# Patient Record
Sex: Female | Born: 1945 | Race: White | Hispanic: No | Marital: Married | Smoking: Never smoker
Health system: Southern US, Community
[De-identification: ages and names within clinical notes are randomized; demographics above are authoritative.]

## PROBLEM LIST (undated history)

## (undated) DIAGNOSIS — N184 Chronic kidney disease, stage 4 (severe): Secondary | ICD-10-CM

## (undated) DIAGNOSIS — G14 Postpolio syndrome: Secondary | ICD-10-CM

## (undated) DIAGNOSIS — J45909 Unspecified asthma, uncomplicated: Secondary | ICD-10-CM

## (undated) DIAGNOSIS — M1990 Unspecified osteoarthritis, unspecified site: Secondary | ICD-10-CM

## (undated) HISTORY — PX: OTHER SURGICAL HISTORY: SHX169

## (undated) HISTORY — PX: TRACHEAL ESOPHAGEAL PUNCTURE REPAIR: SHX5218

## (undated) HISTORY — PX: ABDOMINAL HYSTERECTOMY: SHX81

---

## 2014-03-05 ENCOUNTER — Emergency Department: Payer: Self-pay | Admitting: Emergency Medicine

## 2014-03-12 ENCOUNTER — Inpatient Hospital Stay: Payer: Self-pay | Admitting: Internal Medicine

## 2014-03-12 LAB — COMPREHENSIVE METABOLIC PANEL
ALBUMIN: 2.6 g/dL — AB (ref 3.4–5.0)
ALK PHOS: 80 U/L
ALT: 21 U/L (ref 12–78)
ANION GAP: 10 (ref 7–16)
AST: 31 U/L (ref 15–37)
BUN: 30 mg/dL — ABNORMAL HIGH (ref 7–18)
Bilirubin,Total: 0.3 mg/dL (ref 0.2–1.0)
Calcium, Total: 8.9 mg/dL (ref 8.5–10.1)
Chloride: 100 mmol/L (ref 98–107)
Co2: 23 mmol/L (ref 21–32)
Creatinine: 1.55 mg/dL — ABNORMAL HIGH (ref 0.60–1.30)
EGFR (Non-African Amer.): 34 — ABNORMAL LOW
GFR CALC AF AMER: 39 — AB
Glucose: 113 mg/dL — ABNORMAL HIGH (ref 65–99)
OSMOLALITY: 273 (ref 275–301)
Potassium: 3.4 mmol/L — ABNORMAL LOW (ref 3.5–5.1)
SODIUM: 133 mmol/L — AB (ref 136–145)
TOTAL PROTEIN: 7.1 g/dL (ref 6.4–8.2)

## 2014-03-12 LAB — CBC
HCT: 36.9 % (ref 35.0–47.0)
HGB: 11.9 g/dL — AB (ref 12.0–16.0)
MCH: 28.2 pg (ref 26.0–34.0)
MCHC: 32.3 g/dL (ref 32.0–36.0)
MCV: 87 fL (ref 80–100)
Platelet: 347 10*3/uL (ref 150–440)
RBC: 4.22 10*6/uL (ref 3.80–5.20)
RDW: 15 % — ABNORMAL HIGH (ref 11.5–14.5)
WBC: 14.8 10*3/uL — AB (ref 3.6–11.0)

## 2014-03-12 LAB — TROPONIN I

## 2014-03-13 LAB — COMPREHENSIVE METABOLIC PANEL
ALT: 15 U/L (ref 12–78)
AST: 13 U/L — AB (ref 15–37)
Albumin: 2.2 g/dL — ABNORMAL LOW (ref 3.4–5.0)
Alkaline Phosphatase: 64 U/L
Anion Gap: 7 (ref 7–16)
BUN: 30 mg/dL — ABNORMAL HIGH (ref 7–18)
Bilirubin,Total: 0.4 mg/dL (ref 0.2–1.0)
CREATININE: 1.65 mg/dL — AB (ref 0.60–1.30)
Calcium, Total: 8.4 mg/dL — ABNORMAL LOW (ref 8.5–10.1)
Chloride: 100 mmol/L (ref 98–107)
Co2: 26 mmol/L (ref 21–32)
EGFR (African American): 36 — ABNORMAL LOW
EGFR (Non-African Amer.): 31 — ABNORMAL LOW
Glucose: 200 mg/dL — ABNORMAL HIGH (ref 65–99)
Osmolality: 278 (ref 275–301)
Potassium: 3.2 mmol/L — ABNORMAL LOW (ref 3.5–5.1)
Sodium: 133 mmol/L — ABNORMAL LOW (ref 136–145)
TOTAL PROTEIN: 5.8 g/dL — AB (ref 6.4–8.2)

## 2014-03-13 LAB — CBC WITH DIFFERENTIAL/PLATELET
Basophil #: 0 10*3/uL (ref 0.0–0.1)
Basophil %: 0.1 %
Eosinophil #: 0.6 10*3/uL (ref 0.0–0.7)
Eosinophil %: 2.6 %
HCT: 32.8 % — AB (ref 35.0–47.0)
HGB: 10.5 g/dL — ABNORMAL LOW (ref 12.0–16.0)
LYMPHS PCT: 6.3 %
Lymphocyte #: 1.3 10*3/uL (ref 1.0–3.6)
MCH: 28 pg (ref 26.0–34.0)
MCHC: 32 g/dL (ref 32.0–36.0)
MCV: 88 fL (ref 80–100)
Monocyte #: 0.6 x10 3/mm (ref 0.2–0.9)
Monocyte %: 2.8 %
NEUTROS ABS: 18.8 10*3/uL — AB (ref 1.4–6.5)
Neutrophil %: 88.2 %
Platelet: 343 10*3/uL (ref 150–440)
RBC: 3.75 10*6/uL — ABNORMAL LOW (ref 3.80–5.20)
RDW: 14.9 % — ABNORMAL HIGH (ref 11.5–14.5)
WBC: 21.3 10*3/uL — ABNORMAL HIGH (ref 3.6–11.0)

## 2014-03-13 LAB — URINALYSIS, COMPLETE
BACTERIA: NONE SEEN
Bilirubin,UR: NEGATIVE
Glucose,UR: 50 mg/dL (ref 0–75)
KETONE: NEGATIVE
Leukocyte Esterase: NEGATIVE
NITRITE: NEGATIVE
PH: 6 (ref 4.5–8.0)
RBC,UR: 1 /HPF (ref 0–5)
SPECIFIC GRAVITY: 1.011 (ref 1.003–1.030)

## 2014-03-13 LAB — TSH: THYROID STIMULATING HORM: 0.793 u[IU]/mL

## 2014-03-13 LAB — MAGNESIUM: Magnesium: 1.3 mg/dL — ABNORMAL LOW

## 2014-03-14 LAB — BASIC METABOLIC PANEL
Anion Gap: 7 (ref 7–16)
BUN: 32 mg/dL — AB (ref 7–18)
Calcium, Total: 8.9 mg/dL (ref 8.5–10.1)
Chloride: 106 mmol/L (ref 98–107)
Co2: 25 mmol/L (ref 21–32)
Creatinine: 1.71 mg/dL — ABNORMAL HIGH (ref 0.60–1.30)
GFR CALC AF AMER: 35 — AB
GFR CALC NON AF AMER: 30 — AB
GLUCOSE: 118 mg/dL — AB (ref 65–99)
Osmolality: 284 (ref 275–301)
Potassium: 3.8 mmol/L (ref 3.5–5.1)
Sodium: 138 mmol/L (ref 136–145)

## 2014-03-14 LAB — CBC WITH DIFFERENTIAL/PLATELET
Basophil #: 0 10*3/uL (ref 0.0–0.1)
Basophil %: 0.1 %
EOS PCT: 0.4 %
Eosinophil #: 0.1 10*3/uL (ref 0.0–0.7)
HCT: 34.3 % — AB (ref 35.0–47.0)
HGB: 10.9 g/dL — ABNORMAL LOW (ref 12.0–16.0)
Lymphocyte #: 1.1 10*3/uL (ref 1.0–3.6)
Lymphocyte %: 6.6 %
MCH: 28 pg (ref 26.0–34.0)
MCHC: 31.8 g/dL — AB (ref 32.0–36.0)
MCV: 88 fL (ref 80–100)
Monocyte #: 0.3 x10 3/mm (ref 0.2–0.9)
Monocyte %: 1.8 %
NEUTROS ABS: 15.7 10*3/uL — AB (ref 1.4–6.5)
Neutrophil %: 91.1 %
Platelet: 371 10*3/uL (ref 150–440)
RBC: 3.89 10*6/uL (ref 3.80–5.20)
RDW: 15.3 % — ABNORMAL HIGH (ref 11.5–14.5)
WBC: 17.3 10*3/uL — AB (ref 3.6–11.0)

## 2014-03-15 LAB — BASIC METABOLIC PANEL
ANION GAP: 8 (ref 7–16)
BUN: 30 mg/dL — ABNORMAL HIGH (ref 7–18)
CO2: 24 mmol/L (ref 21–32)
Calcium, Total: 8.7 mg/dL (ref 8.5–10.1)
Chloride: 106 mmol/L (ref 98–107)
Creatinine: 1.54 mg/dL — ABNORMAL HIGH (ref 0.60–1.30)
EGFR (Non-African Amer.): 34 — ABNORMAL LOW
GFR CALC AF AMER: 39 — AB
Glucose: 138 mg/dL — ABNORMAL HIGH (ref 65–99)
OSMOLALITY: 284 (ref 275–301)
POTASSIUM: 3.9 mmol/L (ref 3.5–5.1)
Sodium: 138 mmol/L (ref 136–145)

## 2014-03-15 LAB — CBC WITH DIFFERENTIAL/PLATELET
Basophil #: 0 10*3/uL (ref 0.0–0.1)
Basophil %: 0.1 %
Eosinophil #: 0 10*3/uL (ref 0.0–0.7)
Eosinophil %: 0.1 %
HCT: 33.4 % — ABNORMAL LOW (ref 35.0–47.0)
HGB: 10.8 g/dL — AB (ref 12.0–16.0)
LYMPHS ABS: 1 10*3/uL (ref 1.0–3.6)
LYMPHS PCT: 7.9 %
MCH: 28.4 pg (ref 26.0–34.0)
MCHC: 32.4 g/dL (ref 32.0–36.0)
MCV: 88 fL (ref 80–100)
Monocyte #: 0.3 x10 3/mm (ref 0.2–0.9)
Monocyte %: 2.3 %
NEUTROS ABS: 11.5 10*3/uL — AB (ref 1.4–6.5)
Neutrophil %: 89.6 %
PLATELETS: 392 10*3/uL (ref 150–440)
RBC: 3.81 10*6/uL (ref 3.80–5.20)
RDW: 15.1 % — ABNORMAL HIGH (ref 11.5–14.5)
WBC: 12.8 10*3/uL — ABNORMAL HIGH (ref 3.6–11.0)

## 2014-03-16 LAB — BASIC METABOLIC PANEL
Anion Gap: 9 (ref 7–16)
BUN: 37 mg/dL — ABNORMAL HIGH (ref 7–18)
Calcium, Total: 8.7 mg/dL (ref 8.5–10.1)
Chloride: 104 mmol/L (ref 98–107)
Co2: 23 mmol/L (ref 21–32)
Creatinine: 1.24 mg/dL (ref 0.60–1.30)
EGFR (African American): 51 — ABNORMAL LOW
EGFR (Non-African Amer.): 44 — ABNORMAL LOW
GLUCOSE: 162 mg/dL — AB (ref 65–99)
OSMOLALITY: 284 (ref 275–301)
Potassium: 4.1 mmol/L (ref 3.5–5.1)
Sodium: 136 mmol/L (ref 136–145)

## 2014-03-16 LAB — VANCOMYCIN, TROUGH: Vancomycin, Trough: 20 ug/mL (ref 10–20)

## 2014-03-17 LAB — CULTURE, BLOOD (SINGLE)

## 2015-01-27 NOTE — Discharge Summary (Signed)
PATIENT NAME:  Brooke York, Brooke York MR#:  [id number] DATE OF BIRTH:  Aug 02, 1946  DATE OF ADMISSION:  03/12/2014 DATE OF DISCHARGE:  03/16/2014  PRESENTING COMPLAINT: Rash, generalized.   DISCHARGE DIAGNOSES:  1.  Generalized rash, suspected drug-induced due to Keflex, improved.  2.  Left lower extremity cellulitis.  3.  Acute renal failure, resolved.  4.  History of post polio syndrome.  5.  History of asthma.   CODE STATUS: Full code.   MEDICATIONS:  1.  Premarin 0.625 p.o. daily.  2.  Bactroban 2% apply to affected area 3 times a day.  3.  Duloxetine 60 mg delayed-release p.o. daily.  4.  Singulair 10 mg daily.  5.  Proventil 2 puffs 4 times a day as needed.  6.  Clonidine 0.2 mg b.i.d.  7.  Prednisone 20 mg taper x 10 mg daily, then stop.  8.  Bactrim DS 1 tablet b.i.d. for 6 more days.  9.  Hydralazine 25 mg 1 tablet every 8 hours as needed if systolic blood pressure greater than 160. 10. Krill oil 2 capsules daily.  11. Ubiquinol 1 capsule p.o. daily.  12. Probiotic 1 p.o. daily.  13. Dextromethorphan guaifenesin 1 tablet as needed for congestion.  14. Salmeterol 50 mcg inhalation 1 puff b.i.d.  15. Tylenol 325 mg 2 tablets every 4 hours as needed.   FOLLOWUP: With Dr. Larwance SachsBabaoff in 1 to 2 weeks.   LABS: Creatinine at discharge 1.24.   HISTORY OF PRESENT ILLNESS: Brooke York is a 69 year old Caucasian female with history of post polio syndrome, asthma, who comes into the hospital due to left lower extremity redness, swelling and failed outpatient treatment for cellulitis. She was admitted with:  1.  Left lower extremity cellulitis. Ultrasound of her lower extremity was negative for DVT. The patient was started on IV antibiotics and switched to p.o. Bactrim, which she was able to tolerate and has taken it before. Her creatinine has improved. She will finish up the course of Bactrim. Blood cultures remain negative. White count was trending down. The patient remained afebrile.  2.  Drug  rash likely due to Keflex. The patient was put on Keflex for cellulitis as an outpatient and developed macular rash throughout the body. She improved with prednisone and taper and Benadryl p.r.n.  3.  Hypertension, labile. She was taken off her triamterene/hydrochlorothiazide due to renal failure. Will continue clonidine and p.r.n. hydralazine was given.  4.  Asthma. No exacerbation. Continued Atrovent and Serevent. Hospital stay otherwise remained stable.   CODE STATUS: She remained a full code.   TIME SPENT: 40 minutes.  ____________________________ Wylie HailSona A. Allena KatzPatel, MD sap:aw D: 03/17/2014 06:51:11 ET T: 03/17/2014 07:21:46 ET JOB#: [id number]  cc: Moishe Schellenberg A. Allena KatzPatel, MD, <Dictator> Kandyce RudMarcus Babaoff, MD Willow OraSONA A Pamela Maddy MD ELECTRONICALLY SIGNED 04/05/2014 13:36

## 2015-01-27 NOTE — H&P (Signed)
PATIENT NAME:  Brooke York, Brooke York MR#:  [id number] DATE OF BIRTH:  [birth year]  DATE OF ADMISSION:  03/12/2014  CHIEF COMPLAINT: Edema, erythema of the left lower extremity after a fall 2 weeks ago and disseminated rash.   PRIMARY CARE PHYSICIAN: Kandyce Rud, MD  REFERRING PHYSICIAN: Darien Ramus, MD  HISTORY OF PRESENT ILLNESS:  This is a very nice 69 year old female with history of polio, hypopnea, hypertension, severe asthma, respiratory failure requiring BiPAP and oxygen at night only, endometriosis and hyperlipidemia. The patient comes today with a history of having a fall 2 weeks ago just because she was feeling weak, hitting her head and left lower extremity. Her left lower extremity has a bruise and the bruise enlarged at the level of the pretibial area to the point that it started having a scab on the top of the leg and forming an ulcer that is healing with a scab. This happened 2 weeks ago. She was seen in the Emergency Department. CAT scan of the head and the tibia were done and did not show any severe abnormalities. She was given antibiotics his past Monday, Keflex, because of erythema getting worse and suppuration at the level of the scab. The patient started taking the antibiotics and had some improvement for 2 to 3 days but yesterday, the erythema started getting more diffuse, covering the whole anterior tibial area up to the dorsum of the foot. On top of that, she started developing a rash that happened for the last 48 hours, 4 to 5 days after starting taking Keflex. The rash is diffuse at the level of the chest, arms and legs. Since the patient had some worsening and is starting to get more debilitated yesterday with increased work of breathing and shortness of breath, she decided to come today to the Emergency Department. In the Emergency Department, the patient is evaluated. She is showing is significant signs of sepsis with an elevation of heart rate and white blood cell count. The  patient is admitted for treatment of cellulitis that failed treatment outpatient. Apparently, she has fever of 101 yesterday. She was awake all night having fevers, chills and she was slightly confused.   REVIEW OF SYSTEMS: CONSTITUTIONAL: Positive fever. Positive chills. Positive fatigue and weakness. No significant weight loss or weight gain.  EYES: No blurry vision, double vision or changes in her eyesight.  EARS, NOSE, THROAT: No difficulty swallowing, tinnitus.  RESPIRATORY: No cough. Positive increased shortness of breath. Positive weakness. The patient had significant restrictive lung disease and whenever she gets any type of infection that demands use of energy, her respiratory muscles usually get exhausted.  Right now, she is on BiPAP.  CARDIOVASCULAR: No chest pain or orthopnea.  GASTROINTESTINAL: No nausea, vomiting, abdominal pain, constipation, diarrhea.  GENITOURINARY: No dysuria, hematuria.  ENDOCRINE: No polyuria, polydipsia, cold or heat intolerance.  HEMATOLOGIC AND LYMPHATIC: No anemia, easy bruising or bleeding.  SKIN: Positive rash, no petechiae.  MUSCULOSKELETAL: No significant neck pain or back pain. The patient has significant polio with spine deformity and muscle weakness.  NEUROLOGIC: Positive polio. Negative numbness, tingling, vertigo or ataxia.  PSYCHIATRIC: No insomnia or depression.   PAST MEDICAL HISTORY: 1.  Sleep apnea.  2.  Polio.  3.  Hypertension.  4.  Asthma.  5.  Chronic respiratory failure, on 2 liters of oxygen at night.  6.  Endometriosis.  7.  Hyperlipidemia.  8.  Restrictive lung disease with hypopnea.  9.  History of mild depression but well controlled.  ALLERGIES: THE PATIENT IS ALLERGIC TO MULTIPLE MEDICATIONS, CALCIUM CHANNEL BLOCKERS, PENICILLIN, TETRACYCLINE, VASORETIC, VASOTEC.   PAST SURGICAL HISTORY: 1.  Hysterectomy with oophorectomy due to endometriosis in the mid 70s.  2.  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  3.  Tracheostomy with reversal and plastic surgery to fix the defect.   FAMILY HISTORY: Positive for myocardial infarction in her father who also had dialysis and died by a cerebrovascular accident.  No cancer in the family, no diabetes. Positive hypertension.    SOCIAL HISTORY: The patient never smoked. Does not drink. Lives with husband.    CURRENT MEDICATIONS:  1.  Proventil HFA.  2.  Premarin 0.625 mg daily.  3.  Singulair 10 mg daily.  4.  Hydrochlorothiazide with triamterene and 50/75 mg daily.  5.  Duloxetine 60 mg once a day.  6.  Cephalexin 250 mg 4 times a day.  7.  Bactroban as needed.   PHYSICAL EXAMINATION: VITAL SIGNS: Blood pressure 178/83, pulse 96, respirations 17, temperature 100.7, oxygen saturation 99% on 2 liters of oxygen and BiPAP.  GENERAL: The patient is alert and oriented x 3, in mild respiratory distress due to weakness, tiredness, exhaustion.  HEENT: Her pupils are equal and reactive. Extraocular movements are intact. Mucosae are dry. Anicteric sclerae. Pink conjunctivae. No oral lesions. No oropharyngeal exudates.  NECK: Supple. No JVD. No thyromegaly. No adenopathy. No carotid bruits. No rigidity.  CARDIOVASCULAR: Regular rate and rhythm. Slightly tachycardic. No rubs or gallops. No murmurs. No displacement of PMI.  LUNGS: Clear without any wheezing or crepitus. Decreased respiratory air entrance at the level of the right base. 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 No dullness to percussion.  ABDOMEN: Soft, nontender, nondistended. No hepatosplenomegaly. No masses. Bowel sounds are positive.  EXTREMITIES: Left lower extremity with mild edema compared to the right side, erythema from the knee down. The patient has a scab with no secretions. No signs of abscesses at the level of the proximal pretibial area. There is erythema going down into the foot. The  toes have some purple coloration which is secondary to a previous injury, fall, with ecchymosis. Actually, the patient says that this ecchymosis is fading out now. Pulses +2. Capillary refill less than 3. Denna HaggardHomans is equivocal. The patient has tenderness to palpation of the calf muscle.  SKIN: As mentioned above, erythema of the left lower extremity but she also had some papular, diffuse, rash which is erythematous, red to pink in coloration, slightly raises, welt-like, not pruritic without any secretions. No petechiae. No purpura. The rash is diffused at the level of the chest, anterior and posterior neck, arms and legs, especially on the anterior areas.  NEUROLOGIC: Cranial nerves II through XII intact. Strength is decreased in upper and lower extremities symmetrically due to her polio and to acute disease. No focal findings. Cranial nerves II through XII are intact. Deep tendon reflexes are decreased on the lower extremities, +1.  PSYCHIATRIC: The patient is very pleasant and alert, oriented x 3.  LYMPHATIC: Negative for lymphadenopathy in neck or supraclavicular areas.  MUSCULOSKELETAL: Significant deformities due to polio at the level of the spine and hands mostly, and feet.   LABORATORY, DIAGNOSTIC AND RADIOLOGICAL DATA:  Glucose 113, BUN 30, creatinine 1.55, sodium 133, potassium 3.4, GFR is around 39, total protein 71, bilirubin 0.3. Troponin 0.02. White count 14,000, hemoglobin 11.9, platelet count 343.  ASSESSMENT AND PLAN: A 69 year old female with polio, hypertension, hypopnea,  restrictive lung disease, asthma comes today with worsening cellulitis, failure to treatment outpatient.  1.  Cellulitis. The patient has reddish cellulitis with systemic symptoms, combination of cellulitis and erysipela, started on the calf after trauma 2 weeks ago. She has been taking cephalexin which showed an improvement for 2 to 3 days and then worsening. The patient is admitted for treatment of parenteral therapy  with IV antibiotics. The patient was started on clindamycin in the ER, we are going to switch to vancomycin. Blood cultures have been taken. Unable to take any cultures from the wound as there is no significant suppuration. Continue to monitor.  2.  Sepsis. Again blood cultures taken. The patient meets criteria based on increased white blood cells and tachycardia,  3.  Chronic respiratory failure. At this moment, the patient has an acute exacerbation of this disease due to fatigue of the muscles. The patient has significant polio with deformity of the chest due to a scoliosis with significant restriction, especially of the right lung. Her peak flows are usually around 300s on a good day, right now she has been getting the 200s. She has been on a BiPAP since yesterday at home due to the fatigue of the muscles. Continue oxygen supply. At this moment, I do not think there is a need to give her steroids as there is no significant wheezing. Mostly, this is a restrictive problem and try to avoid steroids as the patient has been fighting an infection. Continue to monitor.  Call pulmonary if patient is starting to have any worsening. Dr. Meredeth Ide is her outpatient pulmonologist.  4.  Hypertension. The patient has increase of her blood pressure, has not been able to take her blood pressure medications. At this moment, we are going to continue with her hydrochlorothiazide with triamterene. Continue to monitor and add hydralazine p.r.n.  5.  Acute on chronic kidney injury. Her creatinine is 1.55. I do not have a baseline to compare but the patient says that she does not have any significant kidney failure, only just small decrease of her function. Her GFR is around 35%.  6.  Hypokalemia. Replace with oral potassium.  7.  Hyponatremia. This is likely secondary to sepsis and intravascular volume depletion. IV fluids with isotonic saline solution given at 75 mL an hour.  8.  Deep vein thrombosis prophylaxis with heparin  based on her kidney failure. The patient has also some edema of the lower extremity, asymmetric, which is likely secondary to her cellulitis but just in case we are going to get an ultrasound Doppler.  9.  Gastrointestinal prophylaxis with Pepcid.  10.  Disseminated rash. The patient has a disseminated rash that could be secondary to the use of Keflex. The patient has history of allergic reaction to penicillin and tetracycline in the past. There is a 25% to 30% cross of reactions with cephalosporins. We are going to stop the Keflex and change her antibiotics to vancomycin. Avoid steroids as mentioned above. Will give Claritin and Benadryl for now.  11.  CODE STATUS:  The patient is a full code.   TIME SPENT: I spent about 50 minutes with this patient.   ____________________________ Felipa Furnace, MD rsg:cs D: 03/12/2014 14:31:28 ET T: 03/12/2014 15:11:13 ET JOB#: [id number]  cc: Felipa Furnace, MD, <Dictator> Sallee Hogrefe Juanda Chance MD ELECTRONICALLY SIGNED 03/12/2014 19:35

## 2015-08-08 ENCOUNTER — Inpatient Hospital Stay
Admission: EM | Admit: 2015-08-08 | Discharge: 2015-08-10 | DRG: 683 | Disposition: A | Discharge status: Home / Self Care | Payer: Medicare Other | Attending: Internal Medicine | Admitting: Internal Medicine

## 2015-08-08 ENCOUNTER — Emergency Department: Payer: Medicare Other

## 2015-08-08 DIAGNOSIS — B9620 Unspecified Escherichia coli [E. coli] as the cause of diseases classified elsewhere: Secondary | ICD-10-CM | POA: Diagnosis present

## 2015-08-08 DIAGNOSIS — Z888 Allergy status to other drugs, medicaments and biological substances status: Secondary | ICD-10-CM | POA: Diagnosis not present

## 2015-08-08 DIAGNOSIS — Z1623 Resistance to quinolones and fluoroquinolones: Secondary | ICD-10-CM | POA: Diagnosis not present

## 2015-08-08 DIAGNOSIS — I129 Hypertensive chronic kidney disease with stage 1 through stage 4 chronic kidney disease, or unspecified chronic kidney disease: Secondary | ICD-10-CM | POA: Diagnosis present

## 2015-08-08 DIAGNOSIS — N184 Chronic kidney disease, stage 4 (severe): Secondary | ICD-10-CM | POA: Diagnosis present

## 2015-08-08 DIAGNOSIS — N390 Urinary tract infection, site not specified: Secondary | ICD-10-CM | POA: Diagnosis present

## 2015-08-08 DIAGNOSIS — E876 Hypokalemia: Secondary | ICD-10-CM | POA: Diagnosis not present

## 2015-08-08 DIAGNOSIS — G14 Postpolio syndrome: Secondary | ICD-10-CM | POA: Diagnosis present

## 2015-08-08 DIAGNOSIS — J9811 Atelectasis: Secondary | ICD-10-CM | POA: Diagnosis present

## 2015-08-08 DIAGNOSIS — R809 Proteinuria, unspecified: Secondary | ICD-10-CM | POA: Diagnosis present

## 2015-08-08 DIAGNOSIS — N179 Acute kidney failure, unspecified: Secondary | ICD-10-CM | POA: Diagnosis present

## 2015-08-08 DIAGNOSIS — E871 Hypo-osmolality and hyponatremia: Secondary | ICD-10-CM | POA: Diagnosis not present

## 2015-08-08 DIAGNOSIS — J986 Disorders of diaphragm: Secondary | ICD-10-CM | POA: Diagnosis present

## 2015-08-08 DIAGNOSIS — M1990 Unspecified osteoarthritis, unspecified site: Secondary | ICD-10-CM | POA: Diagnosis present

## 2015-08-08 DIAGNOSIS — G894 Chronic pain syndrome: Secondary | ICD-10-CM | POA: Diagnosis present

## 2015-08-08 DIAGNOSIS — M419 Scoliosis, unspecified: Secondary | ICD-10-CM | POA: Diagnosis present

## 2015-08-08 DIAGNOSIS — B9689 Other specified bacterial agents as the cause of diseases classified elsewhere: Secondary | ICD-10-CM | POA: Diagnosis present

## 2015-08-08 DIAGNOSIS — Z880 Allergy status to penicillin: Secondary | ICD-10-CM

## 2015-08-08 DIAGNOSIS — Z79899 Other long term (current) drug therapy: Secondary | ICD-10-CM | POA: Diagnosis not present

## 2015-08-08 DIAGNOSIS — N189 Chronic kidney disease, unspecified: Secondary | ICD-10-CM

## 2015-08-08 DIAGNOSIS — Z881 Allergy status to other antibiotic agents status: Secondary | ICD-10-CM | POA: Diagnosis not present

## 2015-08-08 DIAGNOSIS — J45909 Unspecified asthma, uncomplicated: Secondary | ICD-10-CM | POA: Diagnosis present

## 2015-08-08 DIAGNOSIS — M62838 Other muscle spasm: Secondary | ICD-10-CM | POA: Diagnosis present

## 2015-08-08 HISTORY — DX: Postpolio syndrome: G14

## 2015-08-08 HISTORY — DX: Unspecified asthma, uncomplicated: J45.909

## 2015-08-08 HISTORY — DX: Unspecified osteoarthritis, unspecified site: M19.90

## 2015-08-08 LAB — URINALYSIS COMPLETE WITH MICROSCOPIC (ARMC ONLY)
Bilirubin Urine: NEGATIVE
Glucose, UA: NEGATIVE mg/dL
Ketones, ur: NEGATIVE mg/dL
Nitrite: NEGATIVE
PROTEIN: 100 mg/dL — AB
SPECIFIC GRAVITY, URINE: 1.006 (ref 1.005–1.030)
SQUAMOUS EPITHELIAL / LPF: NONE SEEN
pH: 5 (ref 5.0–8.0)

## 2015-08-08 LAB — BASIC METABOLIC PANEL
Anion gap: 12 (ref 5–15)
BUN: 63 mg/dL — AB (ref 6–20)
CHLORIDE: 96 mmol/L — AB (ref 101–111)
CO2: 21 mmol/L — AB (ref 22–32)
CREATININE: 3.27 mg/dL — AB (ref 0.44–1.00)
Calcium: 9.2 mg/dL (ref 8.9–10.3)
GFR calc Af Amer: 15 mL/min — ABNORMAL LOW (ref >60)
GFR calc non Af Amer: 13 mL/min — ABNORMAL LOW (ref >60)
GLUCOSE: 233 mg/dL — AB (ref 65–99)
Potassium: 3 mmol/L — ABNORMAL LOW (ref 3.5–5.1)
Sodium: 129 mmol/L — ABNORMAL LOW (ref 135–145)

## 2015-08-08 LAB — CBC
HCT: 36.2 % (ref 35.0–47.0)
Hemoglobin: 12.2 g/dL (ref 12.0–16.0)
MCH: 28.8 pg (ref 26.0–34.0)
MCHC: 33.6 g/dL (ref 32.0–36.0)
MCV: 85.7 fL (ref 80.0–100.0)
PLATELETS: 433 10*3/uL (ref 150–440)
RBC: 4.23 MIL/uL (ref 3.80–5.20)
RDW: 14.7 % — ABNORMAL HIGH (ref 11.5–14.5)
WBC: 11.7 10*3/uL — ABNORMAL HIGH (ref 3.6–11.0)

## 2015-08-08 MED ORDER — UBIQUINOL 100 MG PO CAPS: 100.0000 mg | ORAL_CAPSULE | Freq: Every day | ORAL | Status: DC

## 2015-08-08 MED ORDER — HEPARIN SODIUM (PORCINE) 5000 UNIT/ML IJ SOLN
5000.0000 [IU] | Freq: Two times a day (BID) | INTRAMUSCULAR | Status: DC
  Filled 2015-08-08 (×2): qty 1

## 2015-08-08 MED ORDER — ACETAMINOPHEN 500 MG PO TABS
1000.0000 mg | ORAL_TABLET | Freq: Four times a day (QID) | ORAL | Status: DC | PRN
  Administered 2015-08-09 – 2015-08-10 (×2): 1000 mg via ORAL
  Filled 2015-08-08 (×2): qty 2

## 2015-08-08 MED ORDER — IPRATROPIUM BROMIDE 0.02 % IN SOLN
2.5000 mL | Freq: Two times a day (BID) | RESPIRATORY_TRACT | Status: DC
  Administered 2015-08-09 – 2015-08-10 (×3): 0.5 mg via RESPIRATORY_TRACT
  Filled 2015-08-08 (×3): qty 2.5

## 2015-08-08 MED ORDER — MORPHINE SULFATE (PF) 2 MG/ML IV SOLN: 1.0000 mg | INTRAVENOUS | Status: DC | PRN

## 2015-08-08 MED ORDER — CIPROFLOXACIN IN D5W 400 MG/200ML IV SOLN: 400.0000 mg | INTRAVENOUS | Status: DC

## 2015-08-08 MED ORDER — ONDANSETRON HCL 4 MG PO TABS: 4.0000 mg | ORAL_TABLET | Freq: Four times a day (QID) | ORAL | Status: DC | PRN

## 2015-08-08 MED ORDER — BISACODYL 5 MG PO TBEC: 5.0000 mg | DELAYED_RELEASE_TABLET | Freq: Every day | ORAL | Status: DC | PRN

## 2015-08-08 MED ORDER — CIPROFLOXACIN IN D5W 400 MG/200ML IV SOLN
400.0000 mg | INTRAVENOUS | Status: DC
  Filled 2015-08-08: qty 200

## 2015-08-08 MED ORDER — MONTELUKAST SODIUM 10 MG PO TABS
10.0000 mg | ORAL_TABLET | Freq: Every day | ORAL | Status: DC
  Administered 2015-08-09 – 2015-08-10 (×2): 10 mg via ORAL
  Filled 2015-08-08 (×3): qty 1

## 2015-08-08 MED ORDER — AMLODIPINE BESYLATE 5 MG PO TABS
5.0000 mg | ORAL_TABLET | Freq: Every day | ORAL | Status: DC
  Administered 2015-08-09 – 2015-08-10 (×2): 5 mg via ORAL
  Filled 2015-08-08 (×3): qty 1

## 2015-08-08 MED ORDER — CIPROFLOXACIN IN D5W 400 MG/200ML IV SOLN
400.0000 mg | Freq: Once | INTRAVENOUS | Status: AC
  Administered 2015-08-08: 400 mg via INTRAVENOUS
  Filled 2015-08-08: qty 200

## 2015-08-08 MED ORDER — OXYCODONE HCL 5 MG PO TABS: 5.0000 mg | ORAL_TABLET | ORAL | Status: DC | PRN

## 2015-08-08 MED ORDER — ACETAMINOPHEN 650 MG RE SUPP: 650.0000 mg | Freq: Four times a day (QID) | RECTAL | Status: DC | PRN

## 2015-08-08 MED ORDER — ALBUTEROL SULFATE (2.5 MG/3ML) 0.083% IN NEBU
2.5000 mg | INHALATION_SOLUTION | Freq: Four times a day (QID) | RESPIRATORY_TRACT | Status: DC | PRN
  Administered 2015-08-09: 2.5 mg via RESPIRATORY_TRACT
  Filled 2015-08-08 (×2): qty 3

## 2015-08-08 MED ORDER — DULOXETINE HCL 60 MG PO CPEP
60.0000 mg | ORAL_CAPSULE | Freq: Every day | ORAL | Status: DC
  Administered 2015-08-09 – 2015-08-10 (×2): 60 mg via ORAL
  Filled 2015-08-08 (×3): qty 1

## 2015-08-08 MED ORDER — RISAQUAD PO CAPS
1.0000 | ORAL_CAPSULE | Freq: Every day | ORAL | Status: DC
  Administered 2015-08-09 – 2015-08-10 (×2): 1 via ORAL
  Filled 2015-08-08 (×2): qty 1

## 2015-08-08 MED ORDER — ACETAMINOPHEN 325 MG PO TABS
650.0000 mg | ORAL_TABLET | Freq: Four times a day (QID) | ORAL | Status: DC | PRN
  Administered 2015-08-09: 650 mg via ORAL
  Filled 2015-08-08: qty 2

## 2015-08-08 MED ORDER — ESTROGENS CONJUGATED 0.625 MG PO TABS
0.6250 mg | ORAL_TABLET | Freq: Every day | ORAL | Status: DC
  Administered 2015-08-09 – 2015-08-10 (×2): 0.625 mg via ORAL
  Filled 2015-08-08 (×3): qty 1

## 2015-08-08 MED ORDER — CIPROFLOXACIN IN D5W 400 MG/200ML IV SOLN
400.0000 mg | Freq: Two times a day (BID) | INTRAVENOUS | Status: DC
Start: 2015-08-08 — End: 2015-08-08

## 2015-08-08 MED ORDER — VITAMIN D 50 MCG (2000 UT) PO TABS
2000.0000 [IU] | ORAL_TABLET | Freq: Every day | ORAL | Status: DC
  Administered 2015-08-09: 2000 [IU] via ORAL
  Filled 2015-08-08 (×3): qty 1

## 2015-08-08 MED ORDER — ENOXAPARIN SODIUM 30 MG/0.3ML ~~LOC~~ SOLN
30.0000 mg | SUBCUTANEOUS | Status: DC
  Filled 2015-08-08: qty 0.3

## 2015-08-08 MED ORDER — ONDANSETRON HCL 4 MG/2ML IJ SOLN
4.0000 mg | Freq: Four times a day (QID) | INTRAMUSCULAR | Status: DC | PRN
  Administered 2015-08-09: 4 mg via INTRAVENOUS
  Filled 2015-08-08: qty 2

## 2015-08-08 MED ORDER — SPIRONOLACTONE 25 MG PO TABS
50.0000 mg | ORAL_TABLET | Freq: Every day | ORAL | Status: DC
  Administered 2015-08-09 – 2015-08-10 (×2): 50 mg via ORAL
  Filled 2015-08-08 (×3): qty 2

## 2015-08-08 MED ORDER — SODIUM CHLORIDE 0.9 % IV BOLUS (SEPSIS)
1000.0000 mL | Freq: Once | INTRAVENOUS | Status: AC
  Administered 2015-08-08: 1000 mL via INTRAVENOUS

## 2015-08-08 MED ORDER — POTASSIUM CHLORIDE IN NACL 20-0.9 MEQ/L-% IV SOLN
INTRAVENOUS | Status: DC
  Administered 2015-08-08 – 2015-08-10 (×4): via INTRAVENOUS
  Filled 2015-08-08 (×5): qty 1000

## 2015-08-08 MED ORDER — MOMETASONE FURO-FORMOTEROL FUM 200-5 MCG/ACT IN AERO
2.0000 | INHALATION_SPRAY | Freq: Two times a day (BID) | RESPIRATORY_TRACT | Status: DC
  Filled 2015-08-08: qty 8.8

## 2015-08-08 MED ORDER — OMEGA-3-ACID ETHYL ESTERS 1 G PO CAPS
1000.0000 mg | ORAL_CAPSULE | Freq: Every day | ORAL | Status: DC
  Administered 2015-08-09 – 2015-08-10 (×2): 1000 mg via ORAL
  Filled 2015-08-08 (×2): qty 1

## 2015-08-08 MED ORDER — POLYETHYLENE GLYCOL 3350 17 G PO PACK: 17.0000 g | PACK | Freq: Every day | ORAL | Status: DC | PRN

## 2015-08-08 MED ORDER — VITAMIN D 1000 UNITS PO TABS
2000.0000 [IU] | ORAL_TABLET | Freq: Every day | ORAL | Status: DC
  Administered 2015-08-09 – 2015-08-10 (×2): 2000 [IU] via ORAL
  Filled 2015-08-08 (×3): qty 2

## 2015-08-08 NOTE — Progress Notes (Signed)
69 y/o F admitted with acute on chronic renal failure with CrCl of 11 ml/min ordered Lovenox 30 mg daily for DVT prophylaxis. Due to CrCl < 15 ml/min, will transition patient to heparin 5000 units [place] bid.   Luisa HartScott Delynda Sepulveda, PharmD

## 2015-08-08 NOTE — H&P (Signed)
[facility] [place] [facility]   PATIENT NAME: Brooke York    MR#:  [id number]  DATE OF BIRTH:  09/04/1946  DATE OF ADMISSION:  08/08/2015  PRIMARY CARE PHYSICIAN: BABAOFF, Lavada MesiMARC E, MD   REQUESTING/REFERRING PHYSICIAN: Dr Carollee MassedKaminski  CHIEF COMPLAINT:   Back pain and neck pain and foul-smelling urine. My kidney numbers are rising HISTORY OF PRESENT ILLNESS:  Brooke York  is a 69 y.o. female with a known history of post polio syndrome, chronic pain, history of asthma comes to the emergency room after she was found to have elevated creatinine and then her baseline by her primary care physician. Patient also has been noticing foul-smelling urine and significant back pain and muscle spasms as part of her postpolio syndrome. According to labs in Epic her baseline creatinine is 1.24 as of June 2015. Creatinine today is 3.27. Patient is being admitted for acute on chronic renal failure and urinary tract infection. Her urine looks abnormal. She received a dose of IV Cipro  PAST MEDICAL HISTORY:   Past Medical History  Diagnosis Date  . Post-polio syndrome   . Osteoarthritis   . Asthma     PAST SURGICAL HISTOIRY:  History reviewed. No pertinent past surgical history.  SOCIAL HISTORY:   Social History  Substance Use Topics  . Smoking status: Never Smoker   . Smokeless tobacco: Not on file  . Alcohol Use: No    FAMILY HISTORY:  History reviewed. No pertinent family history.  DRUG ALLERGIES:   Allergies  Allergen Reactions  . Vasotec [Enalapril Maleate] Anaphylaxis  . Keflex [Cephalexin] Hives and Swelling  . Tetracyclines & Related Other (See Comments)    Pt states that this medication caused a vaginal infection.    Marland Kitchen. Penicillins Hives and Other (See Comments)    Has patient had a PCN reaction causing immediate rash, facial/tongue/throat swelling, SOB or lightheadedness with hypotension: No Has patient had a PCN reaction causing severe rash  involving mucus membranes or skin necrosis: No Has patient had a PCN reaction that required hospitalization No Has patient had a PCN reaction occurring within the last 10 years: No If all of the above answers are "NO", then may proceed with Cephalosporin use.    REVIEW OF SYSTEMS:  Review of Systems  Constitutional: Negative for fever, chills and weight loss.  HENT: Negative for ear discharge, ear pain and nosebleeds.   Eyes: Negative for blurred vision, pain and discharge.  Respiratory: Negative for sputum production, shortness of breath, wheezing and stridor.   Cardiovascular: Negative for chest pain, palpitations, orthopnea and PND.  Gastrointestinal: Negative for nausea, vomiting, abdominal pain and diarrhea.  Genitourinary: Positive for dysuria and frequency. Negative for urgency.  Musculoskeletal: Positive for myalgias and back pain. Negative for joint pain.  Neurological: Positive for weakness. Negative for sensory change, speech change and focal weakness.  Psychiatric/Behavioral: Negative for depression and hallucinations. The patient is not nervous/anxious.      MEDICATIONS AT HOME:   Prior to Admission medications   Medication Sig Start Date End Date Taking? Authorizing Provider  acetaminophen (TYLENOL) 500 MG tablet Take 1,000 mg by mouth every 6 (six) hours as needed for mild pain.   Yes Historical Provider, MD  acidophilus (RISAQUAD) CAPS capsule Take 1 capsule by mouth daily.   Yes Historical Provider, MD  albuterol (PROVENTIL HFA;VENTOLIN HFA) 108 (90 BASE) MCG/ACT inhaler Inhale 2 puffs into the lungs every 6 (six) hours as needed for wheezing or shortness of breath.  Yes Historical Provider, MD  amLODipine (NORVASC) 5 MG tablet Take 5 mg by mouth daily.   Yes Historical Provider, MD  Cholecalciferol (VITAMIN D) 2000 UNITS tablet Take 2,000 Units by mouth daily.   Yes Historical Provider, MD  DULoxetine (CYMBALTA) 60 MG capsule Take 60 mg by mouth daily.   Yes Historical  Provider, MD  estrogens, conjugated, (PREMARIN) 0.625 MG tablet Take 0.625 mg by mouth daily.   Yes Historical Provider, MD  Fluticasone-Salmeterol (ADVAIR) 500-50 MCG/DOSE AEPB Inhale 1 puff into the lungs 2 (two) times daily.   Yes Historical Provider, MD  hydrochlorothiazide (HYDRODIURIL) 12.5 MG tablet Take 12.5 mg by mouth daily.   Yes Historical Provider, MD  ipratropium (ATROVENT HFA) 17 MCG/ACT inhaler Inhale 2 puffs into the lungs 2 (two) times daily.   Yes Historical Provider, MD  Boris Lown Oil 1000 MG CAPS Take 1,000 mg by mouth daily.   Yes Historical Provider, MD  montelukast (SINGULAIR) 10 MG tablet Take 10 mg by mouth daily.   Yes Historical Provider, MD  spironolactone (ALDACTONE) 50 MG tablet Take 50 mg by mouth daily.   Yes Historical Provider, MD  Ubiquinol 100 MG CAPS Take 100 mg by mouth daily.   Yes Historical Provider, MD      VITAL SIGNS:  Blood pressure 146/74, pulse 85, temperature 97.5 F (36.4 C), temperature source Oral, resp. rate 30, height  (1.448 m), weight 52.164 kg (115 lb), SpO2 93 %.  PHYSICAL EXAMINATION:  GENERAL:  69 y.o.-year-old patient lying in the bed with mild acute distress due to chronic pain.  EYES: Pupils equal, round, reactive to light and accommodation. No scleral icterus. Extraocular muscles intact.  HEENT: Head atraumatic, normocephalic. Oropharynx and nasopharynx clear.  NECK:  Supple, no jugular venous distention. No thyroid enlargement, no tenderness.  LUNGS: Normal breath sounds bilaterally, no wheezing, rales,rhonchi or crepitation. No use of accessory muscles of respiration.  CARDIOVASCULAR: S1, S2 normal. No murmurs, rubs, or gallops.  ABDOMEN: Soft, nontender, nondistended. Bowel sounds present. No organomegaly or mass.  EXTREMITIES: No pedal edema, cyanosis, or clubbing. Chronic contractors and upper extremities secondary to postpolio syndrome NEUROLOGIC: Cranial nerves II through XII are intact. Muscle strength 4/5 in all  extremities. Sensation intact. Gait not checked. Patient does have some deformities chronic secondary to postpolio syndrome. PSYCHIATRIC: The patient is alert and oriented x 3.  SKIN: No obvious rash, lesion, or ulcer.   LABORATORY PANEL:   CBC  Recent Labs Lab 08/08/15 1258  WBC 11.7*  HGB 12.2  HCT 36.2  PLT 433   ------------------------------------------------------------------------------------------------------------------  Chemistries   Recent Labs Lab 08/08/15 1258  NA 129*  K 3.0*  CL 96*  CO2 21*  GLUCOSE 233*  BUN 63*  CREATININE 3.27*  CALCIUM 9.2   ------------------------------------------------------------------------------------------------------------------  Cardiac Enzymes No results for input(s): TROPONINI in the last 168 hours. ------------------------------------------------------------------------------------------------------------------  RADIOLOGY:  Dg Chest 1 View  08/08/2015  CLINICAL DATA:  68 year old with current history of post polio syndrome pain and asthma, presenting with generalized weakness. EXAM: Portable CHEST 1 VIEW COMPARISON:  03/12/2014. FINDINGS: Severe thoracic scoliosis convex left and compensatory thoracolumbar scoliosis convex right. Suboptimal inspiration accounts for crowded bronchovascular markings, especially in the bases, and accentuates the cardiac silhouette. Taking this into account, cardiac silhouette upper normal in size. Mild atelectasis in the lung bases. Lungs otherwise clear. No localized airspace consolidation. No pleural effusions. No pneumothorax. Normal pulmonary vascularity. IMPRESSION: Suboptimal inspiration accounts for bibasilar atelectasis. No acute cardiopulmonary disease otherwise. Electronically Signed  By: Hulan Saas M.D.   On: 08/08/2015 18:13   IMPRESSION AND PLAN:   Ms. Dalilah Curlin is a 69 year old Caucasian female with history of post polio syndrome, asthma comes to the emergency room with  increasing creatinine and as per her primary care physician's evaluation and foul-smelling urine.  1. 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 Creatinine today is 2.27. Patient has had poor by mouth intake the last several days. We'll add admit her on medical floor. IV fluids for hydration Avoid nephrotoxins Nephrology consultation with Dr. Thedore Mins Monitor creatinine. Check ultrasound of the kidneys.  2. Urinary tract infection IV Cipro 400 twice a day. Follow-up urine culture change antibiotics accordingly.  3. Muscle spasms secondary to postpolio syndrome Continue her home meds including pain meds.  4. History of asthma continue her inhalers  5. DVT prophylaxis renal dose Lovenox.    All the records are reviewed and case discussed with ED provider. Management plans discussed with the patient, family and they are in agreement.  CODE STATUS: Full  TOTAL TIME TAKING CARE OF THIS PATIENT: 50 minutes.    Deeric Cruise M.D on 08/08/2015 at 7:17 PM  Between 7am to 6pm - Pager - [phone]  After 6pm go to [email or web address] - password [email or web address] [facility]  [place] [facility]  Office  [phone]  CC: Primary care physician; BABAOFF, Lavada Mesi, MD

## 2015-08-08 NOTE — Progress Notes (Signed)
Patient alert and oriented. Patient refuses all scheduled medication due. Patient states that she only takes her medication in the am. Patient also states that she has already taking medication this morning. Education giving to patient on medications that are due. Patient still verbally refuses.

## 2015-08-08 NOTE — ED Notes (Signed)
Patient sent here from Dr. Madelin RearBaboff office due to abnormal blood work.  Patient reports feeling weak.

## 2015-08-08 NOTE — ED Provider Notes (Signed)
[facility] Emergency Department Provider Note  ____________________________________________  Time seen:  1700  I have reviewed the triage vital signs and the nursing notes.   HISTORY  Chief Complaint Weakness     HPI Brooke York is a 69 y.o. female with a history of polio, post polio syndrome, who has chronic kidney disease. She has been told to come to the emergency department due to lab abnormalities that were noted on October 21 of this year. The patient apparently had a follow-up appointment on Monday. She was unable to make it on Monday. That was when they were going to discuss the results from the 21st with her. On Monday she was told to come to the emergency department. The patient has been feeling weak and not feel strong enough to come to the hospital until today.  The patient does report she has some shortness of breath. This is not unusual for her with her post polio syndrome. With this, she has decreased ambulation and activity.  Dr. Nani Gasser off, her primary physician, has been following her renal function tests. Her creatinine increased from the usual 1.5-1.8 area to 2.8 on October 21. Her potassium and sodium levels were normal at that time.    Past Medical History  Diagnosis Date  . Post-polio syndrome   . Osteoarthritis   . Asthma     There are no active problems to display for this patient.   History reviewed. No pertinent past surgical history.  No current outpatient prescriptions on file.  Allergies Vasotec; Keflex; Tetracyclines & related; and Penicillins  History reviewed. No pertinent family history.  Social History Social History  Substance Use Topics  . Smoking status: Never Smoker   . Smokeless tobacco: None  . Alcohol Use: No    Review of Systems  Constitutional: Negative for fever. Positive for general weakness. ENT: Negative for sore throat. Cardiovascular: Negative for chest pain. Respiratory: Mild shortness  for cough. Gastrointestinal: Negative for abdominal pain, vomiting and diarrhea. Genitourinary: History of UTIs. History of chronic kidney disease. Musculoskeletal: No myalgias or injuries. Skin: Negative for rash. Neurological: Negative for headache or focal weakness   10-point ROS otherwise negative.  ____________________________________________   PHYSICAL EXAM:  VITAL SIGNS: ED Triage Vitals  Enc Vitals Group     BP 08/08/15 1257 146/74 mmHg     Pulse Rate 08/08/15 1257 95     Resp 08/08/15 1257 16     Temp 08/08/15 1257 97.5 F (36.4 C)     Temp Source 08/08/15 1257 Oral     SpO2 08/08/15 1257 100 %     Weight 08/08/15 1257 115 lb (52.164 kg)     Height 08/08/15 1257  (1.448 m)     Head Cir --      Peak Flow --      Pain Score --      Pain Loc --      Pain Edu? --      Excl. in GC? --     Constitutional: Alert and oriented. Appears somewhat frail, tachypnea, but alert and communicative and in no acute distress. ENT   Head: Normocephalic and atraumatic.   Nose: No congestion/rhinnorhea.       Mouth: No erythema, no swelling   Cardiovascular: Normal rate, regular rhythm, no murmur noted Respiratory:  Tachypnea with mild decreased lung sounds in the right lung..    Breath sounds are clear and equal bilaterally.  Gastrointestinal: Soft and nontender. No distention.  Back: No  muscle spasm, no tenderness, no CVA tenderness. Musculoskeletal: No deformity noted. Nontender, some muscle loss, patient with limited range of motion..  No noted edema. Neurologic: Frail with some dysfunction of her lower extremities..  Skin:  Skin is warm, dry. No rash noted. Psychiatric: Mood and affect are normal. Speech and behavior are normal.  ____________________________________________    LABS (pertinent positives/negatives)  Labs Reviewed  BASIC METABOLIC PANEL - Abnormal; Notable for the following:    Sodium 129 (*)    Potassium 3.0 (*)    Chloride 96 (*)    CO2 21  (*)    Glucose, Bld 233 (*)    BUN 63 (*)    Creatinine, Ser 3.27 (*)    GFR calc non Af Amer 13 (*)    GFR calc Af Amer 15 (*)    All other components within normal limits  CBC - Abnormal; Notable for the following:    WBC 11.7 (*)    RDW 14.7 (*)    All other components within normal limits  URINALYSIS COMPLETEWITH MICROSCOPIC ([facility] ONLY) - Abnormal; Notable for the following:    Color, Urine YELLOW (*)    APPearance CLOUDY (*)    Hgb urine dipstick 1+ (*)    Protein, ur 100 (*)    Leukocytes, UA 2+ (*)    Bacteria, UA RARE (*)    All other components within normal limits  URINE CULTURE     ____________________________________________  ____________________________________________    RADIOLOGY  Chest x-ray:  FINDINGS: Severe thoracic scoliosis convex left and compensatory thoracolumbar scoliosis convex right. Suboptimal inspiration accounts for crowded bronchovascular markings, especially in the bases, and accentuates the cardiac silhouette. Taking this into account, cardiac silhouette upper normal in size. Mild atelectasis in the lung bases. Lungs otherwise clear. No localized airspace consolidation. No pleural effusions. No pneumothorax. Normal pulmonary vascularity.  IMPRESSION: Suboptimal inspiration accounts for bibasilar atelectasis. No acute cardiopulmonary disease otherwise.  ____________________________________________   PROCEDURES ____________________________________________   INITIAL IMPRESSION / ASSESSMENT AND PLAN / ED COURSE  Pertinent labs & imaging results that were available during my care of the patient were reviewed by me and considered in my medical decision making (see chart for details).  [age] year old female with history of prior polio, post polio syndrome, and chronic renal disease, now with worsened renal function tests. Her creatinine has now elevated to 3.27. Her BUN is 63. Sodium and potassium are both a little bit low.  Will  obtain a urinalysis as well due to her weakness. A chest x-ray because of her shortness of breath. We will discuss the case with nephrology and consider hospitalization for this acute on chronic renal failure.  ----------------------------------------- 6:29 PM on 08/08/2015 -----------------------------------------  Chest x-ray does not show any acute changes. Urinalysis does show white blood cells too numerous to count.  We will give the patient Cipro for the urinary tract infection (patient is allergic to Ceftin)). We'll seek admission for the acute on chronic renal failure.  ----------------------------------------- 6:32 PM on 08/08/2015 -----------------------------------------  This is been discussed with Dr. Allena KatzPatel. She will see the patient and arrange for her admission.  ____________________________________________   FINAL CLINICAL IMPRESSION(S) / ED DIAGNOSES  Final diagnoses:  Acute on chronic renal failure (HCC)  UTI (lower urinary tract infection)  Post-polio syndrome      Darien Ramusavid W Dereon Corkery, MD 08/08/15 [phone]

## 2015-08-08 NOTE — Progress Notes (Signed)
ANTIBIOTIC CONSULT NOTE - INITIAL  Pharmacy Consult for Ciprofloxacin Indication: UTI  Allergies  Allergen Reactions  . Vasotec [Enalapril Maleate] Anaphylaxis  . Keflex [Cephalexin] Hives and Swelling  . Tetracyclines & Related Other (See Comments)    Pt states that this medication caused a vaginal infection.    Marland Kitchen. Penicillins Hives and Other (See Comments)    Has patient had a PCN reaction causing immediate rash, facial/tongue/throat swelling, SOB or lightheadedness with hypotension: No Has patient had a PCN reaction causing severe rash involving mucus membranes or skin necrosis: No Has patient had a PCN reaction that required hospitalization No Has patient had a PCN reaction occurring within the last 10 years: No If all of the above answers are "NO", then may proceed with Cephalosporin use.    Patient Measurements: Height: 4\' 9"  (144.8 cm) Weight: 115 lb (52.164 kg) IBW/kg (Calculated) : 38.6  Vital Signs: Temp: 97.5 F (36.4 C) (11/02 1257) Temp Source: Oral (11/02 1257) BP: 146/74 mmHg (11/02 1257) Pulse Rate: 85 (11/02 1724) Intake/Output from previous day:   Intake/Output from this shift:    Labs:  Recent Labs  08/08/15 1258  WBC 11.7*  HGB 12.2  PLT 433  CREATININE 3.27*   Estimated Creatinine Clearance: 11 mL/min (by C-G formula based on Cr of 3.27). No results for input(s): VANCOTROUGH, VANCOPEAK, VANCORANDOM, GENTTROUGH, GENTPEAK, GENTRANDOM, TOBRATROUGH, TOBRAPEAK, TOBRARND, AMIKACINPEAK, AMIKACINTROU, AMIKACIN in the last 72 hours.   Microbiology: No results found for this or any previous visit (from the past 720 hour(s)).  Medical History: Past Medical History  Diagnosis Date  . Post-polio syndrome   . Osteoarthritis   . Asthma     Medications:  Scheduled:   Infusions:  . ciprofloxacin 400 mg (08/08/15 1854)  . ciprofloxacin    . ciprofloxacin     Assessment: 69 y/o F admitted with acute on chronic renal failure ordered ciprofloxacin for  UTI.   Plan:  Will adjust ciprofloxacin dosing to 400 mg iv q 24 hours for renal insufficiency. Will continue to follow renal function and culture results.   Luisa HartChristy, Kimarie Coor D 08/08/2015,7:27 PM

## 2015-08-08 NOTE — Plan of Care (Signed)
Problem: Tissue Perfusion: Goal: Risk factors for ineffective tissue perfusion will decrease Outcome: Not Progressing Patient refuses VTE  prevention.  SCD's and heparin. Patient education giving to patient about risks and prevention.

## 2015-08-09 ENCOUNTER — Inpatient Hospital Stay: Payer: Medicare Other

## 2015-08-09 LAB — BASIC METABOLIC PANEL
Anion gap: 11 (ref 5–15)
BUN: 50 mg/dL — AB (ref 6–20)
CALCIUM: 8.6 mg/dL — AB (ref 8.9–10.3)
CO2: 20 mmol/L — ABNORMAL LOW (ref 22–32)
CREATININE: 2.46 mg/dL — AB (ref 0.44–1.00)
Chloride: 104 mmol/L (ref 101–111)
GFR, EST AFRICAN AMERICAN: 22 mL/min — AB (ref >60)
GFR, EST NON AFRICAN AMERICAN: 19 mL/min — AB (ref >60)
Glucose, Bld: 97 mg/dL (ref 65–99)
Potassium: 3.3 mmol/L — ABNORMAL LOW (ref 3.5–5.1)
SODIUM: 135 mmol/L (ref 135–145)

## 2015-08-09 LAB — HEMOGLOBIN A1C: HEMOGLOBIN A1C: 6.2 % — AB (ref 4.0–6.0)

## 2015-08-09 LAB — PROTEIN / CREATININE RATIO, URINE
CREATININE, URINE: 26 mg/dL
Protein Creatinine Ratio: 2.77 mg/mg{Cre} — ABNORMAL HIGH (ref 0.00–0.15)
TOTAL PROTEIN, URINE: 72 mg/dL

## 2015-08-09 MED ORDER — HYDRALAZINE HCL 20 MG/ML IJ SOLN
10.0000 mg | Freq: Four times a day (QID) | INTRAMUSCULAR | Status: DC | PRN
  Administered 2015-08-09: 10 mg via INTRAVENOUS
  Filled 2015-08-09: qty 1

## 2015-08-09 MED ORDER — HEPARIN SODIUM (PORCINE) 5000 UNIT/ML IJ SOLN: 5000.0000 [IU] | Freq: Three times a day (TID) | INTRAMUSCULAR | Status: DC

## 2015-08-09 MED ORDER — LABETALOL HCL 100 MG PO TABS
100.0000 mg | ORAL_TABLET | Freq: Two times a day (BID) | ORAL | Status: DC
  Filled 2015-08-09: qty 1

## 2015-08-09 MED ORDER — NON FORMULARY: 500.0000 ug | Freq: Two times a day (BID) | Status: DC

## 2015-08-09 MED ORDER — GUAIFENESIN ER 600 MG PO TB12
600.0000 mg | ORAL_TABLET | Freq: Two times a day (BID) | ORAL | Status: DC
  Administered 2015-08-09 – 2015-08-10 (×2): 600 mg via ORAL
  Filled 2015-08-09 (×2): qty 1

## 2015-08-09 MED ORDER — METOPROLOL TARTRATE 25 MG PO TABS
25.0000 mg | ORAL_TABLET | Freq: Two times a day (BID) | ORAL | Status: DC
  Filled 2015-08-09: qty 1

## 2015-08-09 MED ORDER — CIPROFLOXACIN HCL 500 MG PO TABS
250.0000 mg | ORAL_TABLET | Freq: Two times a day (BID) | ORAL | Status: DC
  Administered 2015-08-09 – 2015-08-10 (×3): 250 mg via ORAL
  Filled 2015-08-09: qty 1
  Filled 2015-08-09: qty 0.5
  Filled 2015-08-09 (×2): qty 1

## 2015-08-09 MED ORDER — LABETALOL HCL 100 MG PO TABS
ORAL_TABLET | ORAL | Status: AC
  Administered 2015-08-09: 100 mg
  Filled 2015-08-09: qty 1

## 2015-08-09 NOTE — Progress Notes (Signed)
[facility] [place] at [facility]   PATIENT NAME: Brooke LatusKaren York    MR#:  [id number]  DATE OF BIRTH:  04/16/1946  SUBJECTIVE:  Patient is doing well this morning. Patient reports her baseline weakness but nothing new.  REVIEW OF SYSTEMS:    Review of Systems  Constitutional: Negative for fever, chills and malaise/fatigue.  HENT: Negative for sore throat.   Eyes: Negative for blurred vision.  Respiratory: Negative for cough, hemoptysis, shortness of breath and wheezing.   Cardiovascular: Negative for chest pain, palpitations and leg swelling.  Gastrointestinal: Negative for nausea, vomiting, abdominal pain, diarrhea and blood in stool.  Genitourinary: Negative for dysuria.  Musculoskeletal: Negative for back pain.  Neurological: Positive for weakness. Negative for dizziness, tremors and headaches.  Endo/Heme/Allergies: Does not bruise/bleed easily.    Tolerating Diet: Yes      DRUG ALLERGIES:   Allergies  Allergen Reactions  . Vasotec [Enalapril Maleate] Anaphylaxis  . Keflex [Cephalexin] Hives and Swelling  . Tetracyclines & Related Other (See Comments)    Pt states that this medication caused a vaginal infection.    Marland Kitchen. Penicillins Hives and Other (See Comments)    Has patient had a PCN reaction causing immediate rash, facial/tongue/throat swelling, SOB or lightheadedness with hypotension: No Has patient had a PCN reaction causing severe rash involving mucus membranes or skin necrosis: No Has patient had a PCN reaction that required hospitalization No Has patient had a PCN reaction occurring within the last 10 years: No If all of the above answers are "NO", then may proceed with Cephalosporin use.    VITALS:  Blood pressure 178/89, pulse 84, temperature 97.7 F (36.5 C), temperature source Oral, resp. rate 16, height 4\' 9"  (1.448 m), weight 46.448 kg (102 lb 6.4 oz), SpO2 98 %.  PHYSICAL EXAMINATION:   Physical Exam  Constitutional: She is  oriented to person, place, and time and well-developed, well-nourished, and in no distress. No distress.  HENT:  Head: Normocephalic.  Eyes: No scleral icterus.  Neck: Normal range of motion. Neck supple. No JVD present. No tracheal deviation present.  Cardiovascular: Normal rate, regular rhythm and normal heart sounds.  Exam reveals no gallop and no friction rub.   No murmur heard. Pulmonary/Chest: Effort normal and breath sounds normal. No respiratory distress. She has no wheezes. She has no rales. She exhibits no tenderness.  Abdominal: Soft. Bowel sounds are normal. She exhibits no distension and no mass. There is no tenderness. There is no rebound and no guarding.  Musculoskeletal: Normal range of motion. She exhibits no edema.  Chronic deformities from polio   Neurological: She is alert and oriented to person, place, and time.  Skin: Skin is warm. No rash noted. No erythema.  Psychiatric: Affect and judgment normal.      LABORATORY PANEL:   CBC  Recent Labs Lab 08/08/15 1258  WBC 11.7*  HGB 12.2  HCT 36.2  PLT 433   ------------------------------------------------------------------------------------------------------------------  Chemistries   Recent Labs Lab 08/09/15 0433  NA 135  K 3.3*  CL 104  CO2 20*  GLUCOSE 97  BUN 50*  CREATININE 2.46*  CALCIUM 8.6*   ------------------------------------------------------------------------------------------------------------------  Cardiac Enzymes No results for input(s): TROPONINI in the last 168 hours. ------------------------------------------------------------------------------------------------------------------  RADIOLOGY:  Dg Chest 1 View  08/08/2015  CLINICAL DATA:  69 year old with current history of post polio syndrome pain and asthma, presenting with generalized weakness. EXAM: Portable CHEST 1 VIEW COMPARISON:  03/12/2014. FINDINGS: Severe thoracic scoliosis convex left and  compensatory thoracolumbar  scoliosis convex right. Suboptimal inspiration accounts for crowded bronchovascular markings, especially in the bases, and accentuates the cardiac silhouette. Taking this into account, cardiac silhouette upper normal in size. Mild atelectasis in the lung bases. Lungs otherwise clear. No localized airspace consolidation. No pleural effusions. No pneumothorax. Normal pulmonary vascularity. IMPRESSION: Suboptimal inspiration accounts for bibasilar atelectasis. No acute cardiopulmonary disease otherwise. Electronically Signed   By: Hulan Saas M.D.   On: 08/08/2015 18:13     ASSESSMENT AND PLAN:   69 year old female with a history of hypertension and postpolio syndrome who presents with weakness and found of unit tract infection and acute renal failure.   1. Acute on chronic kidney failure stage III: Her baseline creatinine is 1.26. Creatinine has improved since admission with IV fluids and holding HCTZ. Patient would prefer nephrology consultation. Continue IV fluids through today.  2. Urinary tract infection without hematuria: Patient has multiple allergies to cephalosporins and penicillin. She is currently on ciprofloxacin. Follow-up on urine culture.  3. Post polio syndrome: Continue home medications.  4. History of asthma: Continue inhalers   Management plans discussed with the patient and she is in agreement.  CODE STATUS: Full  TOTAL TIME TAKING CARE OF THIS PATIENT: 30 minutes.     POSSIBLE D/C tomorrow, DEPENDING ON CLINICAL CONDITION.   Jozette Castrellon M.D on 08/09/2015 at 12:02 PM  Between 7am to 6pm - Pager - [phone] After 6pm go to [email or web address] - password [email or web address] [facility]  [facility]  Office  [phone]  CC: Primary care physician; BABAOFF, Lavada Mesi, MD  Note: This dictation was prepared with Dragon dictation along with smaller phrase technology. Any transcriptional errors that result from this process are unintentional.

## 2015-08-09 NOTE — Clinical Social Work Note (Signed)
Clinical Social Work Assessment  Patient Details  Name: Brooke York MRN: [id number] Date of Birth: September 17, 1946  Date of referral:  08/08/15               Reason for consult:  Facility Placement, Care Management Concerns, Discharge Planning                Permission sought to share information with:  Case Manager Permission granted to share information::  Yes, Verbal Permission Granted  Name::        Agency::     Relationship::     Contact Information:     Housing/Transportation Living arrangements for the past 2 months:  Single Family Home Source of Information:  Spouse Patient Interpreter Needed:  None Criminal Activity/Legal Involvement Pertinent to Current Situation/Hospitalization:  No - Comment as needed Significant Relationships:  Adult Children, Spouse Lives with:  Spouse Do you feel safe going back to the place where you live?  Yes Need for family participation in patient care:  Yes (Comment)  Care giving concerns:  Patient lives in [place] with her husband Orie Fisherman.    Social Worker assessment / plan: Holiday representative (CSW) received consult for D/C planning. CSW met patient and husband Mikki Santee at bedside. Patient was transported to a test/ procedure and husband was alone in room. CSW introduced self and explained role of CSW department. Husband reported that they live in [place] and have 2 adopted children. Husband was very knowledgeable about patient's medical care, diagnosis and medications. Husband reported that he has worked in the health care field for his entire career. Husband reported that patient does not want to be placed in a facility and wants to die at home. Per husband they remodeled their home to accommodate patient. Husband reported that patient has a door bell in her bed room that she can reach from bed if she needs something. Husband reported that patient is at baseline and will not need PT. Husband reported that he is the primary caregiver for patient and  assists her with ADL's. Husband reported that they have a good social support system. Husband inquired about Physicians making house calls. Husband reported that it is difficult to get patient to the doctors office and would like for a PA or Doctor to come to the home to see patient. Husband reported that he would not be interested in home health PT. Husband stated that they have a good set up at home and plan on returning home. RN Case Manager aware of above. Please reconsult if future social work needs arise. CSW signing off.     Employment status:  Disabled (Comment on whether or not currently receiving Disability), Retired Forensic scientist:  Medicare PT Recommendations:  Not assessed at this time Information / Referral to community resources:  Other (Comment Required) (Home Health/ Physicians making house calls. )  Patient/Family's Response to care: Husband prefers to take patient home.   Patient/Family's Understanding of and Emotional Response to Diagnosis, Current Treatment, and Prognosis:  Husband was pleasant throughout assessment and thanked CSW for visit.   Emotional Assessment Appearance:  Appears stated age Attitude/Demeanor/Rapport:  Unable to Assess Affect (typically observed):  Unable to Assess Orientation:  Oriented to Self, Oriented to Place, Oriented to  Time Alcohol / Substance use:  Not Applicable Psych involvement (Current and /or in the community):  No (Comment)  Discharge Needs  Concerns to be addressed:  Discharge Planning Concerns Readmission within the last 30 days:  No Current discharge risk:  Chronically ill Barriers to Discharge:  Continued Medical Work up   Elwyn Reach 08/09/2015, 3:31 PM

## 2015-08-09 NOTE — Consult Note (Signed)
Date: 08/09/2015                  Patient Name:  Brooke York  MRN: [id number]  DOB: 01/25/1946  Age / Sex: 69 y.o., female         PCP: BABAOFF, Lavada MesiMARC E, MD                 Service Requesting Consult:  internal medicine                  Reason for Consult:  acute on chronic renal failure             History of Present Illness: Patient is a 69 y.o. 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 She got a call from her primary care office that her creatinine was elevated . She was sent to the hospital for evaluation. Upon admission, her creatinine was noted to be elevated at 3.27. Baseline creatinine appears to be 1.9/GFR 26 from 04/16/2015 outpatient labs. Patient has history of significant use of nonsteroidal medications for various bovine joint issues including osteoarthritis, scoliosis. Today's creatinine has improved to 2.46 with IV hydration Her urinalysis showed 2+ leukocyte esterase, wbc clumps and too numerous to count bacteria. Urine culture final results are pending. She is currently being treated with ciprofloxacin Overall, she feels better as compared to yesterday Her husband is at bedside   Medications: Outpatient medications: Prescriptions prior to admission  Medication Sig Dispense Refill Last Dose  . acetaminophen (TYLENOL) 500 MG tablet Take 1,000 mg by mouth every 6 (six) hours as needed for mild pain.   08/08/2015 at 1200  . acidophilus (RISAQUAD) CAPS capsule Take 1 capsule by mouth daily.   08/08/2015 at Unknown time  . albuterol (PROVENTIL HFA;VENTOLIN HFA) 108 (90 BASE) MCG/ACT inhaler Inhale 2 puffs into the lungs every 6 (six) hours as needed for wheezing or shortness of breath.   08/08/2015 at Unknown time  . amLODipine (NORVASC) 5 MG tablet Take 5 mg by mouth daily.   08/08/2015 at Unknown time   . Cholecalciferol (VITAMIN D) 2000 UNITS tablet Take 2,000 Units by mouth daily.   08/08/2015 at Unknown time  . DULoxetine (CYMBALTA) 60 MG capsule Take 60 mg by mouth daily.   08/08/2015 at Unknown time  . estrogens, conjugated, (PREMARIN) 0.625 MG tablet Take 0.625 mg by mouth daily.   08/08/2015 at Unknown time  . Fluticasone-Salmeterol (ADVAIR) 500-50 MCG/DOSE AEPB Inhale 1 puff into the lungs 2 (two) times daily.   08/08/2015 at Unknown time  . hydrochlorothiazide (HYDRODIURIL) 12.5 MG tablet Take 12.5 mg by mouth daily.   08/08/2015 at Unknown time  . ipratropium (ATROVENT HFA) 17 MCG/ACT inhaler Inhale 2 puffs into the lungs 2 (two) times daily.   08/08/2015 at Unknown time  . Krill Oil 1000 MG CAPS Take 1,000 mg by mouth daily.   08/08/2015 at Unknown time  . montelukast (SINGULAIR) 10 MG tablet Take 10 mg by mouth daily.   08/08/2015 at Unknown time  . spironolactone (ALDACTONE) 50 MG tablet Take 50 mg by mouth daily.   08/08/2015 at Unknown time  . Ubiquinol 100 MG CAPS Take 100 mg by mouth daily.   08/08/2015 at Unknown time    Current medications: Current Facility-Administered Medications  Medication Dose Route Frequency Provider Last Rate Last Dose  . 0.9 %  NaCl with KCl 20 mEq/ L  infusion   Intravenous Continuous Enedina Finner, MD 85 mL/hr at 08/09/15 1012    . acetaminophen (TYLENOL) tablet 650 mg  650 mg Oral Q6H PRN Enedina Finner, MD       Or  . acetaminophen (TYLENOL) suppository 650 mg  650 mg Rectal Q6H PRN Enedina Finner, MD      . acetaminophen (TYLENOL) tablet 1,000 mg  1,000 mg Oral Q6H PRN Enedina Finner, MD   1,000 mg at 08/09/15 0457  . acidophilus (RISAQUAD) capsule 1 capsule  1 capsule Oral Daily Enedina Finner, MD   1 capsule at 08/09/15 0859  . albuterol (PROVENTIL) (2.5 MG/3ML) 0.083% nebulizer solution 2.5 mg  2.5 mg Inhalation Q6H PRN Enedina Finner, MD      . amLODipine (NORVASC) tablet 5 mg  5 mg Oral Daily Enedina Finner, MD   5 mg at 08/09/15 0859  . bisacodyl (DULCOLAX) EC tablet 5 mg   5 mg Oral Daily PRN Enedina Finner, MD      . cholecalciferol (VITAMIN D) tablet 2,000 Units  2,000 Units Oral Daily Enedina Finner, MD   2,000 Units at 08/09/15 [phone]  . ciprofloxacin (CIPRO) tablet 250 mg  250 mg Oral BID Adrian Saran, MD      . DULoxetine (CYMBALTA) DR capsule 60 mg  60 mg Oral Daily Enedina Finner, MD   60 mg at 08/09/15 0858  . estrogens (conjugated) (PREMARIN) tablet 0.625 mg  0.625 mg Oral Daily Enedina Finner, MD   0.625 mg at 08/09/15 0859  . heparin injection 5,000 Units  5,000 Units Subcutaneous 3 times per day Adrian Saran, MD      . ipratropium (ATROVENT) nebulizer solution 0.5 mg  2.5 mL Inhalation BID Enedina Finner, MD   0.5 mg at 08/09/15 0836  . metoprolol tartrate (LOPRESSOR) tablet 25 mg  25 mg Oral BID Adrian Saran, MD      . mometasone-formoterol (DULERA) 200-5 MCG/ACT inhaler 2 puff  2 puff Inhalation BID Enedina Finner, MD   2 puff at 08/08/15 2247  . montelukast (SINGULAIR) tablet 10 mg  10 mg Oral Daily Enedina Finner, MD   10 mg at 08/09/15 0858  . morphine 2 MG/ML injection 1 mg  1 mg Intravenous Q4H PRN Enedina Finner, MD      . omega-3 acid ethyl esters (LOVAZA) capsule 1,000 mg  1,000 mg Oral Daily Enedina Finner, MD   1,000 mg at 08/09/15 0858  . ondansetron (ZOFRAN) tablet 4 mg  4 mg Oral Q6H PRN Enedina Finner, MD       Or  . ondansetron (ZOFRAN) injection 4 mg  4 mg Intravenous Q6H PRN Enedina Finner, MD      . oxyCODONE (Oxy IR/ROXICODONE) immediate release tablet 5 mg  5 mg Oral Q4H PRN Enedina Finner, MD      . polyethylene glycol (MIRALAX / GLYCOLAX) packet 17 g  17 g Oral Daily PRN Enedina Finner, MD      . spironolactone (ALDACTONE) tablet 50 mg  50 mg Oral Daily Enedina Finner, MD   50 mg at 08/09/15 0858  . Vitamin D 2,000 Units  2,000 Units Oral Daily Enedina Finner, MD   2,000 Units at 08/08/15 2248      Allergies: Allergies  Allergen Reactions  . Vasotec [Enalapril Maleate] Anaphylaxis  . Keflex [Cephalexin] Hives and Swelling  . Tetracyclines & Related Other (See Comments)    Pt states that  this medication caused a vaginal infection.    Marland Kitchen  Penicillins Hives and Other (See Comments)    Has patient had a PCN reaction causing immediate rash, facial/tongue/throat swelling, SOB or lightheadedness with hypotension: No Has patient had a PCN reaction causing severe rash involving mucus membranes or skin necrosis: No Has patient had a PCN reaction that required hospitalization No Has patient had a PCN reaction occurring within the last 10 years: No If all of the above answers are "NO", then may proceed with Cephalosporin use.      Past Medical History: Past Medical History  Diagnosis Date  . Post-polio syndrome   . Osteoarthritis   . Asthma      Past Surgical History: History reviewed. No pertinent past surgical history.   Family History: History reviewed. No pertinent family history.   Social History: Social History   Social History  . Marital Status: Married    Spouse Name: N/A  . Number of Children: N/A  . Years of Education: N/A   Occupational History  . Not on file.   Social History Main Topics  . Smoking status: Never Smoker   . Smokeless tobacco: Not on file  . Alcohol Use: No  . Drug Use: Not on file  . Sexual Activity: Not on file   Other Topics Concern  . Not on file   Social History Narrative  . No narrative on file     Review of Systems: Gen: No fevers, chills or weight changes HEENT: Cataracts CV: No cardiac issues Resp: Patient has asthma and right diaphragm paralysis GI: No acute issues reported GU : No problems with voiding MS: Scoliosis, chronic osteoarthritis and joint pains Derm:  No acute issues Psych: No complaints Heme: No complaints Neuro: No new complaints Endocrine borderline diabetic  Vital Signs: Blood pressure 178/89, pulse 84, temperature 97.7 F (36.5 C), temperature source Oral, resp. rate 16, height 4\' 9"  (1.448 m), weight 46.448 kg (102 lb 6.4 oz), SpO2 98 %.   Intake/Output Summary (Last 24 hours) at  08/09/15 1249 Last data filed at 08/09/15 0950  Gross per 24 hour  Intake 415.08 ml  Output      0 ml  Net 415.08 ml    Weight trends: Filed Weights   08/08/15 1257 08/08/15 2032  Weight: 52.164 kg (115 lb) 46.448 kg (102 lb 6.4 oz)    Physical Exam: General:  thin appearing, sitting on the side of the bed, no distress   HEENT  bilateral cataracts, pupils are round, moist oral mucous membranes   Neck:  supple, no masses   Lungs:  mild right basilar crackles otherwise clear   Heart::  regular rhythm, soft systolic murmur   Abdomen:  soft, nontender   Extremities:  no peripheral edema   Neurologic:  alert, oriented, speech normal   Skin:  normal turgor, some discoloration over the toes possibly from color of the shoes   Access:   Foley:        Lab results: Basic Metabolic Panel:  Recent Labs Lab 08/08/15 1258 08/09/15 0433  NA 129* 135  K 3.0* 3.3*  CL 96* 104  CO2 21* 20*  GLUCOSE 233* 97  BUN 63* 50*  CREATININE 3.27* 2.46*  CALCIUM 9.2 8.6*    Liver Function Tests: No results for input(s): AST, ALT, ALKPHOS, BILITOT, PROT, ALBUMIN in the last 168 hours. No results for input(s): LIPASE, AMYLASE in the last 168 hours. No results for input(s): AMMONIA in the last 168 hours.  CBC:  Recent Labs Lab 08/08/15 1258  WBC 11.7*  HGB 12.2  HCT 36.2  MCV 85.7  PLT 433    Cardiac Enzymes: No results for input(s): CKTOTAL, TROPONINI in the last 168 hours.  BNP: Invalid input(s): POCBNP  CBG: No results for input(s): GLUCAP in the last 168 hours.  Microbiology: Recent Results (from the past 720 hour(s))  Urine culture     Status: None (Preliminary result)   Collection Time: 08/08/15  5:34 PM  Result Value Ref Range Status   Specimen Description URINE, RANDOM  Final   Special Requests NONE  Final   Culture HOLDING FOR POSSIBLE PATHOGEN  Final   Report Status PENDING  Incomplete     Coagulation Studies: No results for input(s): LABPROT, INR in the  last 72 hours.  Urinalysis:  Recent Labs  08/08/15 1734  COLORURINE YELLOW*  LABSPEC 1.006  PHURINE 5.0  GLUCOSEU NEGATIVE  HGBUR 1+*  BILIRUBINUR NEGATIVE  KETONESUR NEGATIVE  PROTEINUR 100*  NITRITE NEGATIVE  LEUKOCYTESUR 2+*      Imaging: Dg Chest 1 View  08/08/2015  CLINICAL DATA:  69 year old with current history of post polio syndrome pain and asthma, presenting with generalized weakness. EXAM: Portable CHEST 1 VIEW COMPARISON:  03/12/2014. FINDINGS: Severe thoracic scoliosis convex left and compensatory thoracolumbar scoliosis convex right. Suboptimal inspiration accounts for crowded bronchovascular markings, especially in the bases, and accentuates the cardiac silhouette. Taking this into account, cardiac silhouette upper normal in size. Mild atelectasis in the lung bases. Lungs otherwise clear. No localized airspace consolidation. No pleural effusions. No pneumothorax. Normal pulmonary vascularity. IMPRESSION: Suboptimal inspiration accounts for bibasilar atelectasis. No acute cardiopulmonary disease otherwise. Electronically Signed   By: Hulan Saas M.D.   On: 08/08/2015 18:13      Assessment & Plan: Pt is a 69 y.o. 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   1. Acute renal failure, likely multifactorial she may have become dehydrated the setting of taking diuretics and new UTI which may have lead to ATN Currently, her serum creatinine is improving with treatment of UTI as well as with IV fluid administration 2. Hyponatremia 3. Hypokalemia 4. Urinary tract infection 5. Chronic kidney disease stage IV. Baseline creatinine 1.9/GFR 26 6. Proteinuria. Also noted in 2015 June  Plan: We will obtain urine protein/creatinine ratio, screening ANA Agree with IV fluid supplementation and electrolyte supplementation as  you're doing Agree with treatment of UTI Patient will need renal imaging in the form of renal ultrasound  Will follow with you

## 2015-08-09 NOTE — Clinical Documentation Improvement (Signed)
Internal Medicine  Please provide "type" and "Frequency" of "unspecified asthma uncomplicated" for greater specificity? Thank you   Childhood  Allergic Extrinsic  Due to ( e.g.detergent fumes)  Atopic  Intrinsic, non-allergic  Hay Fever  Idiosyncratic  Late Onset  Nervous  Platinum  Mixed  Other specified cause    And   Frequency:  mild intermittent  Persistent  mild persistent  moderate persistent  severe persistent.   Other  Clinically Undetermined   Please exercise your independent, professional judgment when responding. A specific answer is not anticipated or expected.   Thank You, Brooke JumboLawanda J York [facility] [place] [phone]

## 2015-08-10 LAB — BASIC METABOLIC PANEL
Anion gap: 9 (ref 5–15)
BUN: 43 mg/dL — AB (ref 6–20)
CO2: 19 mmol/L — AB (ref 22–32)
Calcium: 8.5 mg/dL — ABNORMAL LOW (ref 8.9–10.3)
Chloride: 109 mmol/L (ref 101–111)
Creatinine, Ser: 2.47 mg/dL — ABNORMAL HIGH (ref 0.44–1.00)
GFR calc Af Amer: 21 mL/min — ABNORMAL LOW (ref >60)
GFR, EST NON AFRICAN AMERICAN: 19 mL/min — AB (ref >60)
GLUCOSE: 94 mg/dL (ref 65–99)
POTASSIUM: 4.3 mmol/L (ref 3.5–5.1)
Sodium: 137 mmol/L (ref 135–145)

## 2015-08-10 LAB — PROTEIN ELECTRO, RANDOM URINE: Total Protein, Urine: UNDETERMINED mg/dL

## 2015-08-10 LAB — PROTEIN ELECTROPHORESIS, SERUM
A/G Ratio: 0.9 (ref 0.7–1.7)
ALBUMIN ELP: 2.8 g/dL — AB (ref 2.9–4.4)
ALPHA-2-GLOBULIN: 1.1 g/dL — AB (ref 0.4–1.0)
Alpha-1-Globulin: 0.4 g/dL (ref 0.0–0.4)
BETA GLOBULIN: 1.1 g/dL (ref 0.7–1.3)
GAMMA GLOBULIN: 0.5 g/dL (ref 0.4–1.8)
Globulin, Total: 3.2 g/dL (ref 2.2–3.9)
Total Protein ELP: 6 g/dL (ref 6.0–8.5)

## 2015-08-10 MED ORDER — HYDRALAZINE HCL 25 MG PO TABS: 25.0000 mg | ORAL_TABLET | Freq: Three times a day (TID) | ORAL | Status: AC

## 2015-08-10 MED ORDER — CIPROFLOXACIN HCL 250 MG PO TABS
250.0000 mg | ORAL_TABLET | Freq: Two times a day (BID) | ORAL | Status: DC
Start: 2015-08-10 — End: 2016-11-29

## 2015-08-10 NOTE — Progress Notes (Signed)
Called Dr. Juliene PinaMody apparently during night patient teley was discontinued no documentation of MD approval.  She advised ok to leave patient off of telemetry.  Patient refuses heparin and was educated and MD advised ok to discontinue Heparin order.  [facility] and MD approved discontinuing that and replacing with order of patients own Advair inhaler.

## 2015-08-10 NOTE — Discharge Summary (Signed)
[facility] [place] [facility]   PATIENT NAME: Brooke LatusKaren York    MR#:  [id number]  DATE OF BIRTH:  09/08/1946  DATE OF ADMISSION:  08/08/2015 ADMITTING PHYSICIAN: Enedina FinnerSona Patel, MD  DATE OF DISCHARGE:08/10/2015  PRIMARY CARE PHYSICIAN: BABAOFF, MARC E, MD    ADMISSION DIAGNOSIS:  UTI (lower urinary tract infection) [N39.0] Acute on chronic renal failure (HCC) [N17.9, N18.9] Post-polio syndrome [G14]  DISCHARGE DIAGNOSIS:  Active Problems:   Acute on chronic renal failure (HCC)   SECONDARY DIAGNOSIS:   Past Medical History  Diagnosis Date  . Post-polio syndrome   . Osteoarthritis   . Asthma     HOSPITAL COURSE:   69 y.o. 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   1. Acute renal failure, likely multifactorial she may have become dehydrated the setting of taking diuretics and new UTI which may have lead to ATN Her serum creatinine improved with treatment of UTI as well as with IV fluid administration.  2. Urinary tract infection without hematuria: Patient has multiple allergies to cephalosporins and penicillin. She is currently on ciprofloxacin. She is doing well on this and therfore will be discharged. Urine culture final results are pending  3. Post polio syndrome: Continue home medications.  4. History of asthma: Continue inhalers  DISCHARGE CONDITIONS AND DIET:  Stable to HOME Regular diet  CONSULTS OBTAINED:  Treatment Team:  Mosetta PigeonHarmeet Singh, MD  DRUG ALLERGIES:   Allergies  Allergen Reactions  . Vasotec [Enalapril Maleate] Anaphylaxis  . Keflex [Cephalexin] Hives and Swelling  . Tetracyclines & Related Other (See Comments)    Pt states that this medication caused a vaginal infection.    Marland Kitchen. Penicillins Hives and Other (See Comments)    Has patient had a PCN reaction causing immediate  rash, facial/tongue/throat swelling, SOB or lightheadedness with hypotension: No Has patient had a PCN reaction causing severe rash involving mucus membranes or skin necrosis: No Has patient had a PCN reaction that required hospitalization No Has patient had a PCN reaction occurring within the last 10 years: No If all of the above answers are "NO", then may proceed with Cephalosporin use.    DISCHARGE MEDICATIONS:   Current Discharge Medication List    START taking these medications   Details  ciprofloxacin (CIPRO) 250 MG tablet Take 1 tablet (250 mg total) by mouth 2 (two) times daily. Qty: 14 tablet, Refills: 0    hydrALAZINE (APRESOLINE) 25 MG tablet Take 1 tablet (25 mg total) by mouth 3 (three) times daily. Qty: 90 tablet, Refills: 0      CONTINUE these medications which have NOT CHANGED   Details  acetaminophen (TYLENOL) 500 MG tablet Take 1,000 mg by mouth every 6 (six) hours as needed for mild pain.    acidophilus (RISAQUAD) CAPS capsule Take 1 capsule by mouth daily.    albuterol (PROVENTIL HFA;VENTOLIN HFA) 108 (90 BASE) MCG/ACT inhaler Inhale 2 puffs into the lungs every 6 (six) hours as needed for wheezing or shortness of breath.    amLODipine (NORVASC) 5 MG tablet Take 5 mg by mouth daily.    Cholecalciferol (VITAMIN D) 2000 UNITS tablet Take 2,000 Units by mouth daily.    DULoxetine (CYMBALTA) 60 MG capsule Take 60 mg by mouth daily.    estrogens, conjugated, (PREMARIN) 0.625 MG tablet Take 0.625 mg by mouth daily.    Fluticasone-Salmeterol (ADVAIR)  500-50 MCG/DOSE AEPB Inhale 1 puff into the lungs 2 (two) times daily.    ipratropium (ATROVENT HFA) 17 MCG/ACT inhaler Inhale 2 puffs into the lungs 2 (two) times daily.    Krill Oil 1000 MG CAPS Take 1,000 mg by mouth daily.    montelukast (SINGULAIR) 10 MG tablet Take 10 mg by mouth daily.    Ubiquinol 100 MG CAPS Take 100 mg by mouth daily.      STOP taking these medications     hydrochlorothiazide  (HYDRODIURIL) 12.5 MG tablet      spironolactone (ALDACTONE) 50 MG tablet               Today   CHIEF COMPLAINT:  Patient doing well this morning.   VITAL SIGNS:  Blood pressure 176/78, pulse 79, temperature 98.4 F (36.9 C), temperature source Oral, resp. rate 18, height  (1.448 m), weight 46.448 kg (102 lb 6.4 oz), SpO2 95 %.   REVIEW OF SYSTEMS:  Review of Systems  Constitutional: Negative for fever, chills and malaise/fatigue.  HENT: Negative for sore throat.   Eyes: Negative for blurred vision.  Respiratory: Negative for cough, hemoptysis, shortness of breath and wheezing.   Cardiovascular: Negative for chest pain, palpitations and leg swelling.  Gastrointestinal: Negative for nausea, vomiting, abdominal pain, diarrhea and blood in stool.  Genitourinary: Negative for dysuria.  Musculoskeletal: Negative for back pain.  Neurological: Negative for dizziness, tremors and headaches.  Endo/Heme/Allergies: Does not bruise/bleed easily.     PHYSICAL EXAMINATION:  GENERAL:  69 y.o.-year-old patient lying in the bed with no acute distress.  NECK:  Supple, no jugular venous distention. No thyroid enlargement, no tenderness.  LUNGS: Normal breath sounds bilaterally, no wheezing, rales,rhonchi  No use of accessory muscles of respiration.  CARDIOVASCULAR: S1, S2 normal. No murmurs, rubs, or gallops.  ABDOMEN: Soft, non-tender, non-distended. Bowel sounds present. No organomegaly or mass.  EXTREMITIES: No pedal edema, cyanosis, or clubbing.  PSYCHIATRIC: The patient is alert and oriented x 3.  SKIN: No obvious rash, lesion, or ulcer.  Multiple deformities from post polio syndrome DATA REVIEW:   CBC  Recent Labs Lab 08/08/15 1258  WBC 11.7*  HGB 12.2  HCT 36.2  PLT 433    Chemistries   Recent Labs Lab 08/10/15 0522  NA 137  K 4.3  CL 109  CO2 19*  GLUCOSE 94  BUN 43*  CREATININE 2.47*  CALCIUM 8.5*    Cardiac Enzymes No results for input(s):  TROPONINI in the last 168 hours.  Microbiology Results  @  RADIOLOGY:  Dg Chest 1 View  08/08/2015  CLINICAL DATA:  69 year old with current history of post polio syndrome pain and asthma, presenting with generalized weakness. EXAM: Portable CHEST 1 VIEW COMPARISON:  03/12/2014. FINDINGS: Severe thoracic scoliosis convex left and compensatory thoracolumbar scoliosis convex right. Suboptimal inspiration accounts for crowded bronchovascular markings, especially in the bases, and accentuates the cardiac silhouette. Taking this into account, cardiac silhouette upper normal in size. Mild atelectasis in the lung bases. Lungs otherwise clear. No localized airspace consolidation. No pleural effusions. No pneumothorax. Normal pulmonary vascularity. IMPRESSION: Suboptimal inspiration accounts for bibasilar atelectasis. No acute cardiopulmonary disease otherwise. Electronically Signed   By: Hulan Saas M.D.   On: 08/08/2015 18:13   US Renal  08/09/2015  CLINICAL DATA:  Acute renal failure. Current urinary tract infection. EXAM: RENAL / URINARY TRACT ULTRASOUND COMPLETE COMPARISON:  None. FINDINGS: Right Kidney: Length: 9.1 cm.  Mildly increased echogenicity.  No hydronephrosis. Left Kidney:  Length: 8.6 cm.  Mildly increased echogenicity.  No hydronephrosis. Bladder: Within normal limits. Exam degradation secondary to patient immobility and body habitus. IMPRESSION: 1.  No hydronephrosis. 2. Increased renal echogenicity, suggesting medical renal disease. 3. Decreased sensitivity and specificity exam due to technique related factors, as described above. Electronically Signed   By: Jeronimo Greaves M.D.   On: 08/09/2015 15:02      Management plans discussed with the patient and she is in agreement. Stable for discharge home  Patient should follow up with PCP in one week  CODE STATUS:     Code Status Orders        Start     Ordered   08/08/15 2049  Full code   Continuous     08/08/15 2048       TOTAL TIME TAKING CARE OF THIS PATIENT: 35 minutes.    Note: This dictation was prepared with Dragon dictation along with smaller phrase technology. Any transcriptional errors that result from this process are unintentional.  Kimra Kantor M.D on 08/10/2015 at 12:09 PM  Between 7am to 6pm - Pager - [phone] After 6pm go to [email or web address] - password [email or web address] [facility]  [facility]  Office  [phone]  CC: Primary care physician; BABAOFF, Lavada Mesi, MD

## 2015-08-10 NOTE — Progress Notes (Signed)
Subjective:  Overall feeling well Electrolytes have improved Potassium is now normal Serum creatinine is about the same as yesterday. GFR 19   Objective:  Vital signs in last 24 hours:  Temp:  [97.6 F (36.4 C)-98.4 F (36.9 C)] 98.4 F (36.9 C) (11/04 0736) Pulse Rate:  [61-112] 79 (11/04 0736) Resp:  [16-19] 18 (11/04 0736) BP: (101-179)/(54-90) 176/78 mmHg (11/04 0736) SpO2:  [94 %-97 %] 95 % (11/04 0823)  Weight change:  Filed Weights   08/08/15 1257 08/08/15 2032  Weight: 52.164 kg (115 lb) 46.448 kg (102 lb 6.4 oz)    Intake/Output:    Intake/Output Summary (Last 24 hours) at 08/10/15 1154 Last data filed at 08/10/15 0900  Gross per 24 hour  Intake   2605 ml  Output      0 ml  Net   2605 ml     Physical Exam: General:  no acute distress, laying in the bed   HEENT  BiPAP mask on   Neck  supple, no masses   Pulm/lungs  clear anteriorly and laterally   CVS/Heart  no rub or gallop   Abdomen:   soft, nontender   Extremities:  no peripheral edema   Neurologic:  alert, oriented, speech normal   Skin:  warm, dry, no acute rashes   Access:        Basic Metabolic Panel:   Recent Labs Lab 08/08/15 1258 08/09/15 0433 08/10/15 0522  NA 129* 135 137  K 3.0* 3.3* 4.3  CL 96* 104 109  CO2 21* 20* 19*  GLUCOSE 233* 97 94  BUN 63* 50* 43*  CREATININE 3.27* 2.46* 2.47*  CALCIUM 9.2 8.6* 8.5*     CBC:  Recent Labs Lab 08/08/15 1258  WBC 11.7*  HGB 12.2  HCT 36.2  MCV 85.7  PLT 433      Microbiology:  Recent Results (from the past 720 hour(s))  Urine culture     Status: None (Preliminary result)   Collection Time: 08/08/15  5:34 PM  Result Value Ref Range Status   Specimen Description URINE, RANDOM  Final   Special Requests NONE  Final   Culture   Final    >=100,000 COLONIES/mL GRAM NEGATIVE RODS IDENTIFICATION AND SUSCEPTIBILITIES TO FOLLOW    Report Status PENDING  Incomplete    Coagulation Studies: No results for input(s):  LABPROT, INR in the last 72 hours.  Urinalysis:  Recent Labs  08/08/15 1734  COLORURINE YELLOW*  LABSPEC 1.006  PHURINE 5.0  GLUCOSEU NEGATIVE  HGBUR 1+*  BILIRUBINUR NEGATIVE  KETONESUR NEGATIVE  PROTEINUR 100*  NITRITE NEGATIVE  LEUKOCYTESUR 2+*      Imaging: Dg Chest 1 View  08/08/2015  CLINICAL DATA:  69 year old with current history of post polio syndrome pain and asthma, presenting with generalized weakness. EXAM: Portable CHEST 1 VIEW COMPARISON:  03/12/2014. FINDINGS: Severe thoracic scoliosis convex left and compensatory thoracolumbar scoliosis convex right. Suboptimal inspiration accounts for crowded bronchovascular markings, especially in the bases, and accentuates the cardiac silhouette. Taking this into account, cardiac silhouette upper normal in size. Mild atelectasis in the lung bases. Lungs otherwise clear. No localized airspace consolidation. No pleural effusions. No pneumothorax. Normal pulmonary vascularity. IMPRESSION: Suboptimal inspiration accounts for bibasilar atelectasis. No acute cardiopulmonary disease otherwise. Electronically Signed   By: Hulan Saas M.D.   On: 08/08/2015 18:13   US Renal  08/09/2015  CLINICAL DATA:  Acute renal failure. Current urinary tract infection. EXAM: RENAL / URINARY TRACT ULTRASOUND COMPLETE COMPARISON:  None.  FINDINGS: Right Kidney: Length: 9.1 cm.  Mildly increased echogenicity.  No hydronephrosis. Left Kidney: Length: 8.6 cm.  Mildly increased echogenicity.  No hydronephrosis. Bladder: Within normal limits. Exam degradation secondary to patient immobility and body habitus. IMPRESSION: 1.  No hydronephrosis. 2. Increased renal echogenicity, suggesting medical renal disease. 3. Decreased sensitivity and specificity exam due to technique related factors, as described above. Electronically Signed   By: Jeronimo GreavesKyle  Talbot M.D.   On: 08/09/2015 15:02     Medications:   . 0.9 % NaCl with KCl 20 mEq / L 85 mL/hr at 08/10/15 0952   .  acidophilus  1 capsule Oral Daily  . amLODipine  5 mg Oral Daily  . cholecalciferol  2,000 Units Oral Daily  . ciprofloxacin  250 mg Oral BID  . DULoxetine  60 mg Oral Daily  . estrogens (conjugated)  0.625 mg Oral Daily  . guaiFENesin  600 mg Oral BID  . ipratropium  2.5 mL Inhalation BID  . labetalol  100 mg Oral BID  . montelukast  10 mg Oral Daily  . omega-3 acid ethyl esters  1,000 mg Oral Daily  . spironolactone  50 mg Oral Daily   acetaminophen **OR** acetaminophen, acetaminophen, albuterol, bisacodyl, morphine injection, ondansetron **OR** ondansetron (ZOFRAN) IV, oxyCODONE, polyethylene glycol  Assessment/ Plan:  69 y.o. 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   1. Acute renal failure, likely multifactorial she may have become dehydrated the setting of taking diuretics and new UTI which may have lead to ATN Her serum creatinine improved with treatment of UTI as well as with IV fluid administration 2. Hyponatremia 3. Hypokalemia 4. Urinary tract infection- gram-negative rods. Identification pending 5. Chronic kidney disease stage IV. Baseline creatinine 1.9/GFR 26 6. Proteinuria. Also noted in 2015 June. Protein/creatinine ratio 2.77  Follow-up as outpatient    LOS: 2 Brooke York 11/4/201611:54 AM

## 2015-08-10 NOTE — Care Management Important Message (Signed)
Important Message  Patient Details  Name: Brooke York MRN: [id number] Date of Birth: 06/10/1946   Medicare Important Message Given:  Yes-second notification given    Collie Siadngela Reagen Goates, RN 08/10/2015, 12:50 PM

## 2015-08-11 LAB — URINE CULTURE

## 2015-08-11 NOTE — Progress Notes (Signed)
Patient's urine culture came back positive for Klebsiella and Escherichia coli. Escherichia coli is resistant to ciprofloxacin. It is sensitive to Macrobid however patient has acute kidney injury on chronic renal failure. I spoke with the patient's husband. Patient is doing quite well and is asymptomatic at this point. Patient has a severe allergy to penicillin including hives and therefore cannot take imipenem or cephalosporins. Since the patient is doing well and is asymptomatic at this time I would continue ciprofloxacin for her Klebsiella. She has a follow up on Tuesday with Dr. Thedore MinsSingh and at that time she can have a repeat BMP as well as a urine analysis. I will speak with her after seeing in the a.m.

## 2015-08-12 LAB — ANA W/REFLEX IF POSITIVE: ANA: NEGATIVE

## 2016-11-29 ENCOUNTER — Inpatient Hospital Stay
Admission: EM | Admit: 2016-11-29 | Discharge: 2016-12-04 | DRG: 682 | Disposition: E | Payer: Medicare Other | Attending: Internal Medicine | Admitting: Internal Medicine

## 2016-11-29 ENCOUNTER — Encounter: Payer: Self-pay | Admitting: Emergency Medicine

## 2016-11-29 ENCOUNTER — Emergency Department: Payer: Medicare Other

## 2016-11-29 ENCOUNTER — Inpatient Hospital Stay: Payer: Medicare Other

## 2016-11-29 DIAGNOSIS — Z881 Allergy status to other antibiotic agents status: Secondary | ICD-10-CM

## 2016-11-29 DIAGNOSIS — G4089 Other seizures: Secondary | ICD-10-CM | POA: Diagnosis not present

## 2016-11-29 DIAGNOSIS — Z833 Family history of diabetes mellitus: Secondary | ICD-10-CM | POA: Diagnosis not present

## 2016-11-29 DIAGNOSIS — N170 Acute kidney failure with tubular necrosis: Principal | ICD-10-CM | POA: Diagnosis present

## 2016-11-29 DIAGNOSIS — G4730 Sleep apnea, unspecified: Secondary | ICD-10-CM | POA: Diagnosis present

## 2016-11-29 DIAGNOSIS — E860 Dehydration: Secondary | ICD-10-CM | POA: Diagnosis present

## 2016-11-29 DIAGNOSIS — J986 Disorders of diaphragm: Secondary | ICD-10-CM | POA: Diagnosis present

## 2016-11-29 DIAGNOSIS — M419 Scoliosis, unspecified: Secondary | ICD-10-CM | POA: Diagnosis present

## 2016-11-29 DIAGNOSIS — Z681 Body mass index (BMI) 19 or less, adult: Secondary | ICD-10-CM

## 2016-11-29 DIAGNOSIS — Z515 Encounter for palliative care: Secondary | ICD-10-CM | POA: Diagnosis present

## 2016-11-29 DIAGNOSIS — Z90710 Acquired absence of both cervix and uterus: Secondary | ICD-10-CM

## 2016-11-29 DIAGNOSIS — N179 Acute kidney failure, unspecified: Secondary | ICD-10-CM | POA: Diagnosis present

## 2016-11-29 DIAGNOSIS — J45909 Unspecified asthma, uncomplicated: Secondary | ICD-10-CM | POA: Diagnosis present

## 2016-11-29 DIAGNOSIS — G14 Postpolio syndrome: Secondary | ICD-10-CM | POA: Diagnosis present

## 2016-11-29 DIAGNOSIS — Z66 Do not resuscitate: Secondary | ICD-10-CM | POA: Diagnosis present

## 2016-11-29 DIAGNOSIS — N184 Chronic kidney disease, stage 4 (severe): Secondary | ICD-10-CM | POA: Diagnosis present

## 2016-11-29 DIAGNOSIS — E876 Hypokalemia: Secondary | ICD-10-CM | POA: Diagnosis present

## 2016-11-29 DIAGNOSIS — J9600 Acute respiratory failure, unspecified whether with hypoxia or hypercapnia: Secondary | ICD-10-CM | POA: Diagnosis present

## 2016-11-29 DIAGNOSIS — I129 Hypertensive chronic kidney disease with stage 1 through stage 4 chronic kidney disease, or unspecified chronic kidney disease: Secondary | ICD-10-CM | POA: Diagnosis present

## 2016-11-29 DIAGNOSIS — M1990 Unspecified osteoarthritis, unspecified site: Secondary | ICD-10-CM | POA: Diagnosis present

## 2016-11-29 DIAGNOSIS — R64 Cachexia: Secondary | ICD-10-CM | POA: Diagnosis present

## 2016-11-29 DIAGNOSIS — E872 Acidosis, unspecified: Secondary | ICD-10-CM

## 2016-11-29 DIAGNOSIS — Z7951 Long term (current) use of inhaled steroids: Secondary | ICD-10-CM

## 2016-11-29 DIAGNOSIS — E871 Hypo-osmolality and hyponatremia: Secondary | ICD-10-CM | POA: Diagnosis present

## 2016-11-29 DIAGNOSIS — K859 Acute pancreatitis without necrosis or infection, unspecified: Secondary | ICD-10-CM

## 2016-11-29 DIAGNOSIS — R531 Weakness: Secondary | ICD-10-CM | POA: Diagnosis present

## 2016-11-29 DIAGNOSIS — Z882 Allergy status to sulfonamides status: Secondary | ICD-10-CM

## 2016-11-29 DIAGNOSIS — N3000 Acute cystitis without hematuria: Secondary | ICD-10-CM | POA: Diagnosis present

## 2016-11-29 DIAGNOSIS — R569 Unspecified convulsions: Secondary | ICD-10-CM

## 2016-11-29 DIAGNOSIS — Z8249 Family history of ischemic heart disease and other diseases of the circulatory system: Secondary | ICD-10-CM

## 2016-11-29 DIAGNOSIS — Z888 Allergy status to other drugs, medicaments and biological substances status: Secondary | ICD-10-CM

## 2016-11-29 HISTORY — DX: Chronic kidney disease, stage 4 (severe): N18.4

## 2016-11-29 LAB — URINALYSIS, COMPLETE (UACMP) WITH MICROSCOPIC
BILIRUBIN URINE: NEGATIVE
GLUCOSE, UA: 50 mg/dL — AB
KETONES UR: NEGATIVE mg/dL
LEUKOCYTES UA: NEGATIVE
NITRITE: NEGATIVE
PH: 5 (ref 5.0–8.0)
Protein, ur: >=300 mg/dL — AB
Specific Gravity, Urine: 1.008 (ref 1.005–1.030)

## 2016-11-29 LAB — COMPREHENSIVE METABOLIC PANEL
ALK PHOS: 66 U/L (ref 38–126)
ALT: 18 U/L (ref 14–54)
AST: 22 U/L (ref 15–41)
Albumin: 3.8 g/dL (ref 3.5–5.0)
BILIRUBIN TOTAL: 0.8 mg/dL (ref 0.3–1.2)
BUN: 118 mg/dL — ABNORMAL HIGH (ref 6–20)
CALCIUM: 5.9 mg/dL — AB (ref 8.9–10.3)
CREATININE: 8.19 mg/dL — AB (ref 0.44–1.00)
Chloride: 93 mmol/L — ABNORMAL LOW (ref 101–111)
GFR calc non Af Amer: 4 mL/min — ABNORMAL LOW (ref >60)
GFR, EST AFRICAN AMERICAN: 5 mL/min — AB (ref >60)
GLUCOSE: 82 mg/dL (ref 65–99)
Potassium: 3.4 mmol/L — ABNORMAL LOW (ref 3.5–5.1)
SODIUM: 125 mmol/L — AB (ref 135–145)
TOTAL PROTEIN: 7.4 g/dL (ref 6.5–8.1)

## 2016-11-29 LAB — BASIC METABOLIC PANEL
Anion gap: 21 — ABNORMAL HIGH (ref 5–15)
BUN: 101 mg/dL — AB (ref 6–20)
CALCIUM: 4.8 mg/dL — AB (ref 8.9–10.3)
CO2: 7 mmol/L — ABNORMAL LOW (ref 22–32)
CREATININE: 6.86 mg/dL — AB (ref 0.44–1.00)
Chloride: 98 mmol/L — ABNORMAL LOW (ref 101–111)
GFR calc Af Amer: 6 mL/min — ABNORMAL LOW (ref >60)
GFR, EST NON AFRICAN AMERICAN: 5 mL/min — AB (ref >60)
GLUCOSE: 88 mg/dL (ref 65–99)
POTASSIUM: 2.3 mmol/L — AB (ref 3.5–5.1)
SODIUM: 126 mmol/L — AB (ref 135–145)

## 2016-11-29 LAB — CBC
HCT: 29.7 % — ABNORMAL LOW (ref 35.0–47.0)
Hemoglobin: 9.8 g/dL — ABNORMAL LOW (ref 12.0–16.0)
MCH: 30.9 pg (ref 26.0–34.0)
MCHC: 32.9 g/dL (ref 32.0–36.0)
MCV: 94.2 fL (ref 80.0–100.0)
PLATELETS: 399 10*3/uL (ref 150–440)
RBC: 3.16 MIL/uL — ABNORMAL LOW (ref 3.80–5.20)
RDW: 18.6 % — AB (ref 11.5–14.5)
WBC: 16.9 10*3/uL — ABNORMAL HIGH (ref 3.6–11.0)

## 2016-11-29 LAB — PROTIME-INR
INR: 1.09
Prothrombin Time: 14.1 seconds (ref 11.4–15.2)

## 2016-11-29 LAB — MRSA PCR SCREENING: MRSA BY PCR: NEGATIVE

## 2016-11-29 LAB — BLOOD GAS, VENOUS
Acid-base deficit: 23.4 mmol/L — ABNORMAL HIGH (ref 0.0–2.0)
BICARBONATE: 6.3 mmol/L — AB (ref 20.0–28.0)
DELIVERY SYSTEMS: POSITIVE
FIO2: 0.21
O2 Saturation: 54 %
PO2 VEN: 45 mmHg (ref 32.0–45.0)
Patient temperature: 37
pCO2, Ven: 25 mmHg — ABNORMAL LOW (ref 44.0–60.0)
pH, Ven: 7.01 — CL (ref 7.250–7.430)

## 2016-11-29 LAB — LIPASE, BLOOD: LIPASE: 487 U/L — AB (ref 11–51)

## 2016-11-29 LAB — LACTIC ACID, PLASMA
Lactic Acid, Venous: 0.6 mmol/L (ref 0.5–1.9)
Lactic Acid, Venous: 1.2 mmol/L (ref 0.5–1.9)

## 2016-11-29 LAB — INFLUENZA PANEL BY PCR (TYPE A & B)
Influenza A By PCR: NEGATIVE
Influenza B By PCR: NEGATIVE

## 2016-11-29 LAB — BRAIN NATRIURETIC PEPTIDE: B Natriuretic Peptide: 446 pg/mL — ABNORMAL HIGH (ref 0.0–100.0)

## 2016-11-29 LAB — TROPONIN I: TROPONIN I: 0.04 ng/mL — AB (ref <0.03)

## 2016-11-29 LAB — PROCALCITONIN: Procalcitonin: 0.41 ng/mL

## 2016-11-29 MED ORDER — ESTROGENS CONJUGATED 0.625 MG PO TABS
0.6250 mg | ORAL_TABLET | Freq: Every day | ORAL | Status: DC
  Filled 2016-11-29: qty 1

## 2016-11-29 MED ORDER — MORPHINE SULFATE (PF) 4 MG/ML IV SOLN
2.0000 mg | INTRAVENOUS | Status: DC | PRN
  Filled 2016-11-29: qty 1

## 2016-11-29 MED ORDER — ACETAMINOPHEN 500 MG PO TABS: 1000.0000 mg | ORAL_TABLET | Freq: Four times a day (QID) | ORAL | Status: DC | PRN

## 2016-11-29 MED ORDER — SODIUM CHLORIDE 0.9 % IV SOLN
1.0000 g | Freq: Once | INTRAVENOUS | Status: AC
  Administered 2016-11-29: 1 g via INTRAVENOUS
  Filled 2016-11-29: qty 10

## 2016-11-29 MED ORDER — SODIUM CHLORIDE 0.9 % IV BOLUS (SEPSIS)
1000.0000 mL | Freq: Once | INTRAVENOUS | Status: AC
  Administered 2016-11-29: 1000 mL via INTRAVENOUS

## 2016-11-29 MED ORDER — MONTELUKAST SODIUM 10 MG PO TABS: 10.0000 mg | ORAL_TABLET | Freq: Every day | ORAL | Status: DC

## 2016-11-29 MED ORDER — RISAQUAD PO CAPS: 1.0000 | ORAL_CAPSULE | Freq: Every day | ORAL | Status: DC

## 2016-11-29 MED ORDER — DEXTROSE 5 % IV SOLN
2.0000 g | Freq: Once | INTRAVENOUS | Status: AC
  Administered 2016-11-29: 2 g via INTRAVENOUS
  Filled 2016-11-29: qty 2

## 2016-11-29 MED ORDER — ONDANSETRON HCL 4 MG PO TABS: 4.0000 mg | ORAL_TABLET | Freq: Four times a day (QID) | ORAL | Status: DC | PRN

## 2016-11-29 MED ORDER — MOMETASONE FURO-FORMOTEROL FUM 200-5 MCG/ACT IN AERO
2.0000 | INHALATION_SPRAY | Freq: Two times a day (BID) | RESPIRATORY_TRACT | Status: DC
  Filled 2016-11-29: qty 8.8

## 2016-11-29 MED ORDER — DEXTROSE 5 % IV SOLN: 30.0000 mmol | Freq: Once | INTRAVENOUS | Status: DC

## 2016-11-29 MED ORDER — SODIUM CHLORIDE 0.9% FLUSH
3.0000 mL | Freq: Two times a day (BID) | INTRAVENOUS | Status: DC
  Administered 2016-11-29 – 2016-11-30 (×2): 3 mL via INTRAVENOUS

## 2016-11-29 MED ORDER — SODIUM BICARBONATE 8.4 % IV SOLN
INTRAVENOUS | Status: DC
  Administered 2016-11-29 – 2016-11-30 (×2): via INTRAVENOUS
  Filled 2016-11-29 (×4): qty 150

## 2016-11-29 MED ORDER — UBIQUINOL 100 MG PO CAPS: 100.0000 mg | ORAL_CAPSULE | Freq: Every day | ORAL | Status: DC

## 2016-11-29 MED ORDER — DULOXETINE HCL 30 MG PO CPEP: 60.0000 mg | ORAL_CAPSULE | Freq: Every day | ORAL | Status: DC

## 2016-11-29 MED ORDER — ONDANSETRON HCL 4 MG/2ML IJ SOLN: 4.0000 mg | Freq: Four times a day (QID) | INTRAMUSCULAR | Status: DC | PRN

## 2016-11-29 MED ORDER — VANCOMYCIN HCL IN DEXTROSE 1-5 GM/200ML-% IV SOLN: 1000.0000 mg | Freq: Once | INTRAVENOUS | Status: DC

## 2016-11-29 MED ORDER — VITAMIN D 1000 UNITS PO TABS
2000.0000 [IU] | ORAL_TABLET | Freq: Every day | ORAL | Status: DC
Start: 2016-11-29 — End: 2016-11-30

## 2016-11-29 MED ORDER — AMLODIPINE BESYLATE 5 MG PO TABS: 5.0000 mg | ORAL_TABLET | Freq: Every day | ORAL | Status: DC

## 2016-11-29 MED ORDER — CLONIDINE HCL 0.1 MG PO TABS: 0.1000 mg | ORAL_TABLET | Freq: Three times a day (TID) | ORAL | Status: DC

## 2016-11-29 MED ORDER — CIPROFLOXACIN IN D5W 400 MG/200ML IV SOLN
400.0000 mg | INTRAVENOUS | Status: DC
  Administered 2016-11-29: 400 mg via INTRAVENOUS
  Filled 2016-11-29 (×2): qty 200

## 2016-11-29 MED ORDER — SODIUM CHLORIDE 0.9 % IV SOLN
INTRAVENOUS | Status: DC
  Filled 2016-11-29: qty 150

## 2016-11-29 MED ORDER — SODIUM CHLORIDE 0.9 % IV SOLN
Freq: Once | INTRAVENOUS | Status: AC
  Administered 2016-11-29: 22:00:00 via INTRAVENOUS
  Filled 2016-11-29: qty 1000

## 2016-11-29 MED ORDER — ACETAMINOPHEN 160 MG/5ML PO SOLN
650.0000 mg | Freq: Four times a day (QID) | ORAL | Status: DC | PRN
  Administered 2016-11-29: 650 mg via ORAL
  Filled 2016-11-29 (×3): qty 20.3

## 2016-11-29 MED ORDER — HEPARIN SODIUM (PORCINE) 5000 UNIT/ML IJ SOLN
5000.0000 [IU] | Freq: Three times a day (TID) | INTRAMUSCULAR | Status: DC
Start: 2016-11-29 — End: 2016-11-30
  Filled 2016-11-29: qty 1

## 2016-11-29 NOTE — ED Notes (Signed)
Pt repositioned in bed by this RN and Isaiah BlakesJerrie, Charity fundraiserN. Pt's husband remains at bedside. Will continue to monitor for further patient needs.

## 2016-11-29 NOTE — Progress Notes (Signed)
Pharmacy Antibiotic Note  Brooke York is a 71 y.o. female admitted on June 27, 2017 with UTI.  Pharmacy has been consulted for ciprofloxacin dosing.  Plan: Ciprofloxacin 400 mg IV daily  Height: 5' (152.4 cm) Weight: 108 lb (49 kg) IBW/kg (Calculated) : 45.5  Temp (24hrs), Avg:98.5 F (36.9 C), Min:98.5 F (36.9 C), Max:98.5 F (36.9 C)   Recent Labs Lab 08/29/2017 1201  CREATININE 8.19*  LATICACIDVEN 0.6    Estimated Creatinine Clearance: 4.4 mL/min (by C-G formula based on SCr of 8.19 mg/dL (H)).    Allergies  Allergen Reactions  . Vasotec [Enalapril Maleate] Anaphylaxis  . Keflex [Cephalexin] Hives and Swelling  . Tetracyclines & Related Other (See Comments)    Pt states that this medication caused a vaginal infection.    Marland Kitchen. Penicillins Hives and Other (See Comments)    Has patient had a PCN reaction causing immediate rash, facial/tongue/throat swelling, SOB or lightheadedness with hypotension: No Has patient had a PCN reaction causing severe rash involving mucus membranes or skin necrosis: No Has patient had a PCN reaction that required hospitalization No Has patient had a PCN reaction occurring within the last 10 years: No If all of the above answers are "NO", then may proceed with Cephalosporin use.   Antimicrobials this admission: ciprofloxacin 2/24 >>   Dose adjustments this admission:  Microbiology results:  2/24 Urine: Sent 2/24 Blood: Sent  Thank you for allowing pharmacy to be a part of this patient's care.  Cindi CarbonMary M Gerardo Territo, PharmD Clinical Pharmacist June 27, 2017 2:46 PM

## 2016-11-29 NOTE — H&P (Signed)
[facility] [place] [facility]   PATIENT NAME: Brooke York    MR#:  [id number]  DATE OF BIRTH:  11/04/1945  DATE OF ADMISSION:  11/06/2016  PRIMARY CARE PHYSICIAN: Dr. Kandyce Rud  REQUESTING/REFERRING PHYSICIAN:  Dr. Merrily Brittle  CHIEF COMPLAINT:   Chief Complaint  Patient presents with  . Weakness    HISTORY OF PRESENT ILLNESS:  Katelin Kutsch  is a 71 y.o. 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 Patient has bulbar polio as a result of which she has diaphragmatic weakness, respiratory muscle weakness chronically on BiPAP whenever she is laying flat, upper extremity weakness. According to husband who provides most of the history, patient has been having nausea vomiting and complaining of intermittent abdominal pain for the last 5 days. Her oral intake has significantly been reduced. Since yesterday he hasn't had a need to change her depends at all. So she was brought to the emergency room and noted to be significantly dehydrated. Her creatinine is at 8 and she is anuric at this time. Significantly metabolic acidosis on the exam. She is being admitted for acute renal failure. However patient is completely alert and at her baseline. Denies any fevers or chills. No chest pain or diarrhea at this time. No recent travel. No changes in medications recently.  PAST MEDICAL HISTORY:   Past Medical History:  Diagnosis Date  . Asthma   . Chronic renal disease, stage IV (HCC)   . Osteoarthritis   . Post-polio syndrome     PAST SURGICAL HISTORY:   Past Surgical History:  Procedure Laterality Date  . ABDOMINAL HYSTERECTOMY    . muscle transplant    . TRACHEAL ESOPHAGEAL PUNCTURE REPAIR      SOCIAL HISTORY:   Social History  Substance Use Topics  .  Smoking status: Never Smoker  . Smokeless tobacco: Never Used  . Alcohol use No    FAMILY HISTORY:   Family History  Problem Relation Age of Onset  . Diabetes Mother   . Hypertension Mother   . CAD Father   . Heart failure Father     DRUG ALLERGIES:   Allergies  Allergen Reactions  . Vasotec [Enalapril Maleate] Anaphylaxis  . Keflex [Cephalexin] Hives and Swelling  . Tetracyclines & Related Other (See Comments)    Pt states that this medication caused a vaginal infection.    Marland Kitchen Penicillins Hives and Other (See Comments)    Has patient had a PCN reaction causing immediate rash, facial/tongue/throat swelling, SOB or lightheadedness with hypotension: No Has patient had a PCN reaction causing severe rash involving mucus membranes or skin necrosis: No Has patient had a PCN reaction that required hospitalization No Has patient had a PCN reaction occurring within the last 10 years: No If all of the above answers are "NO", then may proceed with Cephalosporin use.    REVIEW OF SYSTEMS:   Review of Systems  Constitutional: Positive for chills and malaise/fatigue. Negative for fever and weight loss.  HENT: Negative for ear discharge, ear pain, hearing loss and nosebleeds.   Eyes: Negative for blurred vision, double vision and photophobia.  Respiratory: Negative for cough, hemoptysis, shortness of breath and wheezing.   Cardiovascular: Negative for chest pain, palpitations, orthopnea and leg swelling.  Gastrointestinal: Positive for abdominal pain, nausea and  vomiting. Negative for constipation, diarrhea, heartburn and melena.  Genitourinary: Negative for dysuria and urgency.       Anuria  Musculoskeletal: Positive for back pain. Negative for neck pain.  Skin: Negative for rash.  Neurological: Positive for focal weakness. Negative for dizziness, tremors, sensory change, speech change and headaches.  Endo/Heme/Allergies: Does not bruise/bleed easily.  Psychiatric/Behavioral: Negative  for depression.    MEDICATIONS AT HOME:   Prior to Admission medications   Medication Sig Start Date End Date Taking? Authorizing Provider  acetaminophen (TYLENOL) 500 MG tablet Take 1,000 mg by mouth every 6 (six) hours as needed for mild pain.   Yes Historical Provider, MD  acidophilus (RISAQUAD) CAPS capsule Take 1 capsule by mouth daily.   Yes Historical Provider, MD  albuterol (PROVENTIL HFA;VENTOLIN HFA) 108 (90 BASE) MCG/ACT inhaler Inhale 2 puffs into the lungs every 6 (six) hours as needed for wheezing or shortness of breath.   Yes Historical Provider, MD  amLODipine (NORVASC) 5 MG tablet Take 5 mg by mouth daily.   Yes Historical Provider, MD  Cholecalciferol (VITAMIN D) 2000 UNITS tablet Take 2,000 Units by mouth daily.   Yes Historical Provider, MD  cloNIDine (CATAPRES) 0.1 MG tablet Take 0.1 mg by mouth 3 (three) times daily.   Yes Historical Provider, MD  DULoxetine (CYMBALTA) 60 MG capsule Take 60 mg by mouth daily.   Yes Historical Provider, MD  estrogens, conjugated, (PREMARIN) 0.625 MG tablet Take 0.625 mg by mouth daily.   Yes Historical Provider, MD  Fluticasone-Salmeterol (ADVAIR) 500-50 MCG/DOSE AEPB Inhale 1 puff into the lungs 2 (two) times daily.   Yes Historical Provider, MD  furosemide (LASIX) 20 MG tablet Take 20 mg by mouth daily as needed.   Yes Historical Provider, MD  Boris LownKrill Oil 1000 MG CAPS Take 1,000 mg by mouth daily.   Yes Historical Provider, MD  montelukast (SINGULAIR) 10 MG tablet Take 10 mg by mouth daily.   Yes Historical Provider, MD  Ubiquinol 100 MG CAPS Take 100 mg by mouth daily.   Yes Historical Provider, MD  hydrALAZINE (APRESOLINE) 25 MG tablet Take 1 tablet (25 mg total) by mouth 3 (three) times daily. Patient not taking: Reported on 11/27/2016 08/10/15   Adrian SaranSital Mody, MD      VITAL SIGNS:  Blood pressure (!) 179/132, pulse 81, temperature 98.5 F (36.9 C), temperature source Oral, resp. rate 19, height 5' (1.524 m), weight 49 kg (108 lb), SpO2  100 %.  PHYSICAL EXAMINATION:   Physical Exam  GENERAL:  71 y.o.-year-old patient lying in the bed with no acute distress. Head tilted to her right EYES: Pupils equal, round, reactive to light and accommodation. No scleral icterus. Extraocular muscles intact.  HEENT: Head atraumatic, normocephalic. Oropharynx and nasopharynx clear.  NECK:  Supple, no jugular venous distention. No thyroid enlargement, no tenderness.  LUNGS: Normal breath sounds bilaterally, no wheezing, rales,rhonchi or crepitation. No use of accessory muscles of respiration. Decreased bibasilar breath sounds CARDIOVASCULAR: S1, S2 normal. No murmurs, rubs, or gallops.  ABDOMEN: Soft, mild discomfort in the mid abdominal region, nondistended. Bowel sounds present. No organomegaly or mass.  EXTREMITIES: No pedal edema, cyanosis, or clubbing.  NEUROLOGIC: able to move both lower extremities in bed, upper extremities are weak 1/5 strength chronically, sensation is intact. Gait not checked PSYCHIATRIC: The patient is alert and oriented x 3.  SKIN: No obvious rash, lesion, or ulcer.   LABORATORY PANEL:   CBC No results for input(s): WBC, HGB, HCT, PLT in the  last 168 hours. ------------------------------------------------------------------------------------------------------------------  Chemistries   Recent Labs Lab 12/03/2016 1201  NA 125*  K 3.4*  CL 93*  CO2 <7*  GLUCOSE 82  BUN 118*  CREATININE 8.19*  CALCIUM 5.9*  AST 22  ALT 18  ALKPHOS 66  BILITOT 0.8   ------------------------------------------------------------------------------------------------------------------  Cardiac Enzymes  Recent Labs Lab 11/13/2016 1201  TROPONINI 0.04*   ------------------------------------------------------------------------------------------------------------------  RADIOLOGY:  Dg Chest Port 1 View  Result Date: 11/28/2016 CLINICAL DATA:  Weakness.  Sepsis. EXAM: PORTABLE CHEST 1 VIEW COMPARISON:  08/08/2015  FINDINGS: There is a normal heart size. Aortic atherosclerosis noted. There is asymmetric elevation of the right hemidiaphragm. No pleural effusion or edema. No airspace opacities. IMPRESSION: 1. No acute cardiopulmonary abnormalities. Electronically Signed   By: Signa Kell M.D.   On: 11/17/2016 12:30    EKG:  No orders found for this or any previous visit.  IMPRESSION AND PLAN:   Semiyah Newgent  is a 71 y.o. 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  #1 ARF-likely prerenal causes on top of CKD stage IV - baseline cr around 2.5 with gfr 20, now cr at 8 and gfr 4 - nephrology consulted, potassium is within normal limits and patient is completely alert and oriented. -We'll place a Foley catheter, IV fluids with sodium bicarbonate and strict input and output monitoring. -If not improving, might need dialysis. Family and patient aware of it at this time. Will admit to stepdown. -Avoid all nephrotoxic medications  #2 Severe metabolic acidosis- secondary to renal failure. Potassium is within normal limits. Started on bicarbonate drip. Repeat labs later today. -Intensivist consult requested  #3 Hyponatremia-likely hypovolemic. IV fluids and monitor  #4 Acute cystitis-follow blood and urine cultures. Based on her allergies, we'll start on Cipro for now. -Avoid nephrotoxic medications. Meds to be renally dosed  #5 Neurogenic sleep apnea-follows with pulmonary as outpatient. Continue BiPAP whenever lying in bed  #6 Bulbar polio- at baseline, completely alert. Monitor closely  #7 HTN- continue norvasc and clonidine, BP is stable for now  #8 DVT prophylaxis- on SQ heparin   All the records are reviewed and case discussed with ED provider. Management plans discussed with the patient, family  and they are in agreement.  CODE STATUS: Full Code  TOTAL CRITICAL CARE TIME SPENT IN TAKING CARE OF THIS PATIENT: 65 minutes.    Enid Baas M.D on 11/20/2016 at 2:37 PM  Between 7am to 6pm - Pager - [phone]  After 6pm go to [email or web address] - password [email or web address]  [facility]  Office  [phone]  CC: Primary care physician; Pcp Not In System

## 2016-11-29 NOTE — ED Provider Notes (Signed)
[facility] Emergency Department Provider Note  ____________________________________________   First MD Initiated Contact with Patient 2016-12-13 1159     (approximate)  I have reviewed the triage vital signs and the nursing notes.   HISTORY  Chief Complaint Weakness    HPI Brooke York is a 71 y.o. female who comes to the emergency department via EMS for progressive weakness. She has a past medical history of polio and is dependent on her BiPAP at home whenever she feel short of breath. According to her husband the patient has been more fatigued for the past week or so. She had an oral virus and could not keep anything down. She is been unable to eat solid foods for 6 days eating only ensure. She has a home doctor via [facility] and husband called that doctor today who advised the patient to come to the emergency department.According to the husband the patient has been more fatigued recently. She now requires BiPAP nearly all the time.   Past Medical History:  Diagnosis Date  . Asthma   . Chronic renal disease, stage IV (HCC)   . Osteoarthritis   . Post-polio syndrome     Patient Active Problem List   Diagnosis Date Noted  . ARF (acute renal failure) (HCC) 12-13-2016  . Acute on chronic renal failure (HCC) 08/08/2015    Past Surgical History:  Procedure Laterality Date  . ABDOMINAL HYSTERECTOMY    . muscle transplant    . TRACHEAL ESOPHAGEAL PUNCTURE REPAIR      Prior to Admission medications   Medication Sig Start Date End Date Taking? Authorizing Provider  acetaminophen (TYLENOL) 500 MG tablet Take 1,000 mg by mouth every 6 (six) hours as needed for mild pain.   Yes Historical Provider, MD  acidophilus (RISAQUAD) CAPS capsule Take 1 capsule by mouth daily.   Yes Historical Provider, MD  albuterol (PROVENTIL HFA;VENTOLIN HFA) 108 (90 BASE) MCG/ACT inhaler Inhale 2 puffs into the lungs every 6 (six) hours as needed for wheezing or shortness  of breath.   Yes Historical Provider, MD  amLODipine (NORVASC) 5 MG tablet Take 5 mg by mouth daily.   Yes Historical Provider, MD  Cholecalciferol (VITAMIN D) 2000 UNITS tablet Take 2,000 Units by mouth daily.   Yes Historical Provider, MD  cloNIDine (CATAPRES) 0.1 MG tablet Take 0.1 mg by mouth 3 (three) times daily.   Yes Historical Provider, MD  DULoxetine (CYMBALTA) 60 MG capsule Take 60 mg by mouth daily.   Yes Historical Provider, MD  estrogens, conjugated, (PREMARIN) 0.625 MG tablet Take 0.625 mg by mouth daily.   Yes Historical Provider, MD  Fluticasone-Salmeterol (ADVAIR) 500-50 MCG/DOSE AEPB Inhale 1 puff into the lungs 2 (two) times daily.   Yes Historical Provider, MD  furosemide (LASIX) 20 MG tablet Take 20 mg by mouth daily as needed.   Yes Historical Provider, MD  Boris Lown Oil 1000 MG CAPS Take 1,000 mg by mouth daily.   Yes Historical Provider, MD  montelukast (SINGULAIR) 10 MG tablet Take 10 mg by mouth daily.   Yes Historical Provider, MD  Ubiquinol 100 MG CAPS Take 100 mg by mouth daily.   Yes Historical Provider, MD  hydrALAZINE (APRESOLINE) 25 MG tablet Take 1 tablet (25 mg total) by mouth 3 (three) times daily. Patient not taking: Reported on 12/13/2016 08/10/15   Adrian Saran, MD    Allergies Vasotec [enalapril maleate]; Keflex [cephalexin]; Tetracyclines & related; and Penicillins  Family History  Problem Relation Age of  Onset  . Diabetes Mother   . Hypertension Mother   . CAD Father   . Heart failure Father     Social History Social History  Substance Use Topics  . Smoking status: Never Smoker  . Smokeless tobacco: Never Used  . Alcohol use No    Review of Systems Constitutional: No fever/chills Eyes: No visual changes. ENT: No sore throat. Cardiovascular: Denies chest pain. Respiratory: Positive shortness of breath. Gastrointestinal: No abdominal pain.  No nausea, no vomiting.  Positive diarrhea.  No constipation. Genitourinary: Negative for  dysuria. Musculoskeletal: Negative for back pain. Skin: Negative for rash. Neurological: Negative for headaches, focal weakness or numbness.  10-point ROS otherwise negative.  ____________________________________________   PHYSICAL EXAM:  VITAL SIGNS: ED Triage Vitals [11/17/2016 1156]  Enc Vitals Group     BP      Pulse      Resp      Temp      Temp src      SpO2 97 %     Weight      Height      Head Circumference      Peak Flow      Pain Score      Pain Loc      Pain Edu?      Excl. in GC?     Constitutional: Cachectic and chronically ill-appearing using nasal BiPAP in mild to moderate respiratory distress Eyes: Conjunctivae are normal. PERRL. EOMI. Head: Atraumatic. Nose: No congestion/rhinnorhea. Mouth/Throat: Mucous membranes are moist.  Oropharynx non-erythematous. Neck: No stridor.   Cardiovascular: Normal rate, regular rhythm. Grossly normal heart sounds.  Good peripheral circulation. Respiratory: Crackles throughout but moving good air with elevated respiratory rate Gastrointestinal: Soft and nontender. No distention. No abdominal bruits. No CVA tenderness. Musculoskeletal: No lower extremity tenderness nor edema.  No joint effusions. Neurologic:  Decreased strength in all extremities Skin:  Skin is warm, dry and intact. No rash noted. Psychiatric: Mood and affect are normal. Speech and behavior are normal.  ____________________________________________   LABS (all labs ordered are listed, but only abnormal results are displayed)  Labs Reviewed  COMPREHENSIVE METABOLIC PANEL - Abnormal; Notable for the following:       Result Value   Sodium 125 (*)    Potassium 3.4 (*)    Chloride 93 (*)    CO2 <7 (*)    BUN 118 (*)    Creatinine, Ser 8.19 (*)    Calcium 5.9 (*)    GFR calc non Af Amer 4 (*)    GFR calc Af Amer 5 (*)    All other components within normal limits  BRAIN NATRIURETIC PEPTIDE - Abnormal; Notable for the following:    B Natriuretic  Peptide 446.0 (*)    All other components within normal limits  TROPONIN I - Abnormal; Notable for the following:    Troponin I 0.04 (*)    All other components within normal limits  BLOOD GAS, VENOUS - Abnormal; Notable for the following:    pH, Ven 7.01 (*)    pCO2, Ven 25 (*)    Bicarbonate 6.3 (*)    Acid-base deficit 23.4 (*)    All other components within normal limits  URINALYSIS, COMPLETE (UACMP) WITH MICROSCOPIC - Abnormal; Notable for the following:    Color, Urine YELLOW (*)    APPearance HAZY (*)    Glucose, UA 50 (*)    Hgb urine dipstick MODERATE (*)    Protein, ur >=300 (*)  Bacteria, UA RARE (*)    Squamous Epithelial / LPF 0-5 (*)    All other components within normal limits  LIPASE, BLOOD - Abnormal; Notable for the following:    Lipase 487 (*)    All other components within normal limits  BASIC METABOLIC PANEL - Abnormal; Notable for the following:    Sodium 126 (*)    Potassium 2.3 (*)    Chloride 98 (*)    CO2 7 (*)    BUN 101 (*)    Creatinine, Ser 6.86 (*)    Calcium 4.8 (*)    GFR calc non Af Amer 5 (*)    GFR calc Af Amer 6 (*)    Anion gap 21 (*)    All other components within normal limits  CBC - Abnormal; Notable for the following:    WBC 16.9 (*)    RBC 3.16 (*)    Hemoglobin 9.8 (*)    HCT 29.7 (*)    RDW 18.6 (*)    All other components within normal limits  CBC - Abnormal; Notable for the following:    RBC 2.35 (*)    Hemoglobin 7.5 (*)    HCT 20.9 (*)    RDW 17.9 (*)    All other components within normal limits  BASIC METABOLIC PANEL - Abnormal; Notable for the following:    Sodium 131 (*)    CO2 11 (*)    Glucose, Bld 103 (*)    BUN 104 (*)    Creatinine, Ser 7.25 (*)    Calcium 5.2 (*)    GFR calc non Af Amer 5 (*)    GFR calc Af Amer 6 (*)    Anion gap 19 (*)    All other components within normal limits  MAGNESIUM - Abnormal; Notable for the following:    Magnesium 1.1 (*)    All other components within normal limits   PHOSPHORUS - Abnormal; Notable for the following:    Phosphorus 11.8 (*)    All other components within normal limits  BLOOD GAS, ARTERIAL - Abnormal; Notable for the following:    pH, Arterial 7.09 (*)    pCO2 arterial 28 (*)    pO2, Arterial 127 (*)    Bicarbonate 8.5 (*)    Acid-base deficit 19.8 (*)    All other components within normal limits  CULTURE, BLOOD (ROUTINE X 2)  CULTURE, BLOOD (ROUTINE X 2)  MRSA PCR SCREENING  URINE CULTURE  PROCALCITONIN  PROTIME-INR  LACTIC ACID, PLASMA  LACTIC ACID, PLASMA  INFLUENZA PANEL BY PCR (TYPE A & B)   _________Significant acidemia with anion gap acidosis ___________________________________  EKG  ED ECG REPORT I, Merrily Brittle, the attending physician, personally viewed and interpreted this ECG.  Date: 11/30/2016 Rate: 83 Rhythm: normal sinus rhythm QRS Axis: Rightward Intervals: Prolonged QTC ST/T Wave abnormalities: normal Conduction Disturbances: none Narrative Interpretation: No ischemic changes abnormal EKG ________________________________________  RADIOLOGY  Chest x-ray with no acute disease ____________________________________________   PROCEDURES  Procedure(s) performed: no  Procedures  Critical Care performed: Yes  CRITICAL CARE Performed by: Merrily Brittle   Total critical care time: 35 minutes  Critical care time was exclusive of separately billable procedures and treating other patients.  Critical care was necessary to treat or prevent imminent or life-threatening deterioration.  Critical care was time spent personally by me on the following activities: development of treatment plan with patient and/or surrogate as well as nursing, discussions with consultants, evaluation of patient's response  to treatment, examination of patient, obtaining history from patient or surrogate, ordering and performing treatments and interventions, ordering and review of laboratory studies, ordering and review of  radiographic studies, pulse oximetry and re-evaluation of patient's condition.   ____________________________________________   INITIAL IMPRESSION / ASSESSMENT AND PLAN / ED COURSE  Pertinent labs & imaging results that were available during my care of the patient were reviewed by me and considered in my medical decision making (see chart for details).  On arrival the patient is short of breath but appears relatively comfortable on her BiPAP. Differential for weakness in the setting of polio and recent illnesses broad. Labs pending.     ----------------------------------------- 12:28 PM on Dec 03, 2016 -----------------------------------------  Initial blood gas with a pH of 7.01 with a low CO2 and low bicarbonate which is suggestive of a metabolic acidosis with respiratory compensation. I have to assume this is sepsis until proven otherwise so I will cover her for hospital associated pneumonia.   ----------------------------------------- 1:54 PM on 03-Dec-2016 -----------------------------------------  I discussed the case with the on call hospitalist who has graciously agreed to admit the patient to her service.  I then discussed with Dr. Cherylann Ratel of Nephrology who recommends a bicarb drip and he will come evaluate the patient. ____________________________________________   FINAL CLINICAL IMPRESSION(S) / ED DIAGNOSES  Final diagnoses:  Acidemia  Hyponatremia  Acute pancreatitis, unspecified complication status, unspecified pancreatitis type      NEW MEDICATIONS STARTED DURING THIS VISIT:  Current Discharge Medication List       Note:  This document was prepared using Dragon voice recognition software and may include unintentional dictation errors.     Merrily Brittle, MD 11/30/16 1415

## 2016-11-29 NOTE — ED Notes (Signed)
In and out cath performed by this RN and Dorinda Hillonald, Charity fundraiserN.

## 2016-11-29 NOTE — ED Notes (Signed)
This RN attempted to start IV x 2. Dorinda Hillonald, RN attempted to start IV x 2 at this time.

## 2016-11-29 NOTE — ED Notes (Signed)
Dr. Kalisetti at bedside at this time.  

## 2016-11-29 NOTE — ED Triage Notes (Signed)
Pt presents to ED with c/o weakness, abdominal pain, possible dehydration. Per husband pt was dx with stomach bug on Sunday and has "been feeling like she was kicked in the gut". Pt's husband c/o emesis since Sunday, denies any blood at this time. Pt presents on home BiPap machine, pt's husband states she is post-polio syndrome and over the last 2 days pt has not come off of it.

## 2016-11-29 NOTE — Progress Notes (Signed)
Rt called for emergency traffic to room 15, patient presented to ER on her home Bipap unit which she has been wearing continuously for the last two days due to weakness. Patient has post polio syndrome. Patient does not have o2 bled into machine and her saturations are 99-100% at this time. Dr. Lamont Snowballifenbark agreed for patient to stay on her home unit for more comfort. Patient is alert and was able to tell me her home settings which are Ipap of 18 and an Epap of 6. Will continue to monitor.

## 2016-11-29 NOTE — Progress Notes (Signed)
PHARMACIST - PHYSICIAN ORDER COMMUNICATION  CONCERNING: P&T Medication Policy on Herbal Medications  DESCRIPTION:  This patient's order for:  Ubiquinol CAPS 100 mg  has been noted.  This product(s) is classified as an "herbal" or natural product. Due to a lack of definitive safety studies or FDA approval, nonstandard manufacturing practices, plus the potential risk of unknown drug-drug interactions while on inpatient medications, the Pharmacy and Therapeutics Committee does not permit the use of "herbal" or natural products of this type within [facility].   ACTION TAKEN: The pharmacy department is unable to verify this order at this time and your patient has been informed of this safety policy. Please reevaluate patient's clinical condition at discharge and address if the herbal or natural product(s) should be resumed at that time.  Gardner CandleSheema M Nam Vossler, PharmD, BCPS Clinical Pharmacist 04-09-17 4:37 PM

## 2016-11-29 NOTE — ED Notes (Signed)
Pt's husband requesting more warm blankets, no change in patient condition. Will continue to monitor for further patient needs.

## 2016-11-29 NOTE — ED Notes (Signed)
Pt resting in bed, no change in patient condition. Pt's husband remains at bedside at this time. Will continue to monitor for further patient needs.

## 2016-11-29 NOTE — ED Notes (Signed)
Dr. Lamont Snowballifenbark attempted to obtain IV access via IV US without success. Per Dr. Lamont Snowballifenbark, have picc team called.

## 2016-11-29 NOTE — ED Notes (Signed)
Report given to Mayra, RN

## 2016-11-29 NOTE — Consult Note (Signed)
Name: Brooke York MRN: [id number] DOB: 05/11/1946    ADMISSION DATE:  11/17/2016 CONSULTATION DATE:  11/10/2016  REFERRING MD :  Dr. Rinaldo RatelKhalisetti  CHIEF COMPLAINT: acute weakness   HISTORY OF PRESENT ILLNESS:   71 y.o. 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  -Since yesterday he hasn't had a need to change her depends at all. So she was brought to the emergency room and noted to be significantly dehydrated.  -Her creatinine is at 8 and she is anuric at this time. Significantly metabolic acidosis on the exam. She is being admitted for acute renal failure.    PAST MEDICAL HISTORY :   has a past medical history of Asthma; Chronic renal disease, stage IV (HCC); Osteoarthritis; and Post-polio syndrome.  has a past surgical history that includes Abdominal hysterectomy; muscle transplant; and Tracheal esophageal puncture repair. Prior to Admission medications   Medication Sig Start Date End Date Taking? Authorizing Provider  acetaminophen (TYLENOL) 500 MG tablet Take 1,000 mg by mouth every 6 (six) hours as needed for mild pain.   Yes Historical Provider, MD  acidophilus (RISAQUAD) CAPS capsule Take 1 capsule by mouth daily.   Yes Historical Provider, MD  albuterol (PROVENTIL HFA;VENTOLIN HFA) 108 (90 BASE) MCG/ACT inhaler Inhale 2 puffs into the lungs every 6 (six) hours as needed for wheezing or shortness of breath.   Yes Historical Provider, MD  amLODipine (NORVASC) 5 MG tablet  Take 5 mg by mouth daily.   Yes Historical Provider, MD  Cholecalciferol (VITAMIN D) 2000 UNITS tablet Take 2,000 Units by mouth daily.   Yes Historical Provider, MD  cloNIDine (CATAPRES) 0.1 MG tablet Take 0.1 mg by mouth 3 (three) times daily.   Yes Historical Provider, MD  DULoxetine (CYMBALTA) 60 MG capsule Take 60 mg by mouth daily.   Yes Historical Provider, MD  estrogens, conjugated, (PREMARIN) 0.625 MG tablet Take 0.625 mg by mouth daily.   Yes Historical Provider, MD  Fluticasone-Salmeterol (ADVAIR) 500-50 MCG/DOSE AEPB Inhale 1 puff into the lungs 2 (two) times daily.   Yes Historical Provider, MD  furosemide (LASIX) 20 MG tablet Take 20 mg by mouth daily as needed.   Yes Historical Provider, MD  Boris LownKrill Oil 1000 MG CAPS Take 1,000 mg by mouth daily.   Yes Historical Provider, MD  montelukast (SINGULAIR) 10 MG tablet Take 10 mg by mouth daily.   Yes Historical Provider, MD  Ubiquinol 100 MG CAPS Take 100 mg by mouth daily.   Yes Historical Provider, MD  hydrALAZINE (APRESOLINE) 25 MG tablet Take 1 tablet (25 mg total) by mouth 3 (three) times daily. Patient not taking: Reported on 12/03/2016 08/10/15   Adrian SaranSital Mody, MD   Allergies  Allergen Reactions  . Vasotec [Enalapril Maleate] Anaphylaxis  . Keflex [Cephalexin] Hives and Swelling  . Tetracyclines & Related Other (See Comments)    Pt states that this medication caused a vaginal infection.    Marland Kitchen. Penicillins Hives and Other (See Comments)    Has patient had a PCN reaction  causing immediate rash, facial/tongue/throat swelling, SOB or lightheadedness with hypotension: No Has patient had a PCN reaction causing severe rash involving mucus membranes or skin necrosis: No Has patient had a PCN reaction that required hospitalization No Has patient had a PCN reaction occurring within the last 10 years: No If all of the above answers are "NO", then may proceed with Cephalosporin use.    FAMILY HISTORY:  family history includes CAD in her  father; Diabetes in her mother; Heart failure in her father; Hypertension in her mother. SOCIAL HISTORY:  reports that she has never smoked. She has never used smokeless tobacco. She reports that she does not drink alcohol or use drugs.  REVIEW OF SYSTEMS:   Unobtainable due to lethargy  VITAL SIGNS: Temp:  [98.5 F (36.9 C)] 98.5 F (36.9 C) (02/24 1213) Pulse Rate:  [81-85] 83 (02/24 1530) Resp:  [18-24] 19 (02/24 1530) BP: (174-188)/(79-132) 174/81 (02/24 1530) SpO2:  [97 %-100 %] 100 % (02/24 1530) Weight:  [108 lb (49 kg)] 108 lb (49 kg) (02/24 1200)  PHYSICAL EXAMINATION:  GENERAL:critically ill appearing, on biPAP HEAD: Normocephalic, atraumatic.  EYES: Pupils equal, round, reactive to light.  No scleral icterus.  MOUTH: Moist mucosal membrane. NECK: Supple. No thyromegaly. No nodules. No JVD.  PULMONARY: CTA b/l CARDIOVASCULAR: S1 and S2. Regular rate and rhythm. No murmurs, rubs, or gallops.  GASTROINTESTINAL: Soft, nontender, -distended. No masses. Positive bowel sounds. No hepatosplenomegaly.  MUSCULOSKELETAL: No swelling, clubbing, or edema.  NEUROLOGIC: lethargic SKIN:intact,warm,dry     Recent Labs Lab 11/24/2016 1201  NA 125*  K 3.4*  CL 93*  CO2 <7*  BUN 118*  CREATININE 8.19*  GLUCOSE 82   No results for input(s): HGB, HCT, WBC, PLT in the last 168 hours. Dg Chest Port 1 View  Result Date: 11/11/2016 CLINICAL DATA:  Weakness.  Sepsis. EXAM: PORTABLE CHEST 1 VIEW COMPARISON:  08/08/2015 FINDINGS: There is a normal heart size. Aortic atherosclerosis noted. There is asymmetric elevation of the right hemidiaphragm. No pleural effusion or edema. No airspace opacities. IMPRESSION: 1. No acute cardiopulmonary abnormalities. Electronically Signed   By: Signa Kell M.D.   On: 11/09/2016 12:30    ASSESSMENT / PLAN: 71 yo with severe neuromusclular disease with acute renal failure with severe acidosis  1.bicarb infusion 2.biPAP as  needed(chronic) 3.follow up Nephrology consult    Lucie Leather, M.D.  [facility]  Medical Director [facility] Medical Director [facility] Cardio-Pulmonary Department

## 2016-11-30 LAB — CBC
HEMATOCRIT: 20.9 % — AB (ref 35.0–47.0)
HEMOGLOBIN: 7.5 g/dL — AB (ref 12.0–16.0)
MCH: 32 pg (ref 26.0–34.0)
MCHC: 35.9 g/dL (ref 32.0–36.0)
MCV: 89 fL (ref 80.0–100.0)
Platelets: 276 10*3/uL (ref 150–440)
RBC: 2.35 MIL/uL — ABNORMAL LOW (ref 3.80–5.20)
RDW: 17.9 % — AB (ref 11.5–14.5)
WBC: 10.7 10*3/uL (ref 3.6–11.0)

## 2016-11-30 LAB — BLOOD GAS, ARTERIAL
Acid-base deficit: 19.8 mmol/L — ABNORMAL HIGH (ref 0.0–2.0)
Bicarbonate: 8.5 mmol/L — ABNORMAL LOW (ref 20.0–28.0)
Delivery systems: POSITIVE
Expiratory PAP: 12
FIO2: 0.3
INSPIRATORY PAP: 24
O2 Saturation: 97.4 %
PCO2 ART: 28 mmHg — AB (ref 32.0–48.0)
PO2 ART: 127 mmHg — AB (ref 83.0–108.0)
Patient temperature: 37
pH, Arterial: 7.09 — CL (ref 7.350–7.450)

## 2016-11-30 LAB — PHOSPHORUS: PHOSPHORUS: 11.8 mg/dL — AB (ref 2.5–4.6)

## 2016-11-30 LAB — BASIC METABOLIC PANEL
Anion gap: 19 — ABNORMAL HIGH (ref 5–15)
BUN: 104 mg/dL — AB (ref 6–20)
CHLORIDE: 101 mmol/L (ref 101–111)
CO2: 11 mmol/L — AB (ref 22–32)
CREATININE: 7.25 mg/dL — AB (ref 0.44–1.00)
Calcium: 5.2 mg/dL — CL (ref 8.9–10.3)
GFR calc non Af Amer: 5 mL/min — ABNORMAL LOW (ref >60)
GFR, EST AFRICAN AMERICAN: 6 mL/min — AB (ref >60)
GLUCOSE: 103 mg/dL — AB (ref 65–99)
Potassium: 3.8 mmol/L (ref 3.5–5.1)
Sodium: 131 mmol/L — ABNORMAL LOW (ref 135–145)

## 2016-11-30 LAB — MAGNESIUM: MAGNESIUM: 1.1 mg/dL — AB (ref 1.7–2.4)

## 2016-11-30 MED ORDER — MORPHINE BOLUS VIA INFUSION
5.0000 mg | INTRAVENOUS | Status: DC | PRN
  Administered 2016-11-30: 5 mg via INTRAVENOUS
  Filled 2016-11-30: qty 20

## 2016-11-30 MED ORDER — MAGNESIUM SULFATE 4 GM/100ML IV SOLN
4.0000 g | Freq: Once | INTRAVENOUS | Status: DC
  Filled 2016-11-30: qty 100

## 2016-11-30 MED ORDER — SODIUM CHLORIDE 0.9 % IV SOLN: 500.0000 mg | Freq: Two times a day (BID) | INTRAVENOUS | Status: DC

## 2016-11-30 MED ORDER — ALBUTEROL SULFATE (2.5 MG/3ML) 0.083% IN NEBU: 2.5000 mg | INHALATION_SOLUTION | RESPIRATORY_TRACT | Status: DC | PRN

## 2016-11-30 MED ORDER — MORPHINE 100MG IN NS 100ML (1MG/ML) PREMIX INFUSION
10.0000 mg/h | INTRAVENOUS | Status: DC
  Administered 2016-11-30 (×2): 10 mg/h via INTRAVENOUS
  Filled 2016-11-30 (×2): qty 100

## 2016-11-30 MED ORDER — SODIUM CHLORIDE 0.9 % IV SOLN
2.0000 g | Freq: Once | INTRAVENOUS | Status: DC
  Filled 2016-11-30: qty 20

## 2016-11-30 MED ORDER — SODIUM CHLORIDE 0.9 % IV SOLN
750.0000 mg | Freq: Two times a day (BID) | INTRAVENOUS | Status: DC
  Administered 2016-11-30: 750 mg via INTRAVENOUS
  Filled 2016-11-30 (×2): qty 7.5

## 2016-11-30 MED ORDER — LORAZEPAM 2 MG/ML IJ SOLN
2.0000 mg | INTRAMUSCULAR | Status: DC | PRN
  Administered 2016-12-01: 2 mg via INTRAVENOUS
  Filled 2016-11-30: qty 1

## 2016-11-30 NOTE — Clinical Social Work Note (Signed)
CSW received consult for comfort care. CSW is following pending transfer to 1C. If the patient does not transfer by 1 PM, CSW will see in CCU.   Brooke PonderKaren Martha Knoah York, MSW, Brooke MajorsLCSWA [phone]

## 2016-11-30 NOTE — Progress Notes (Signed)
Just after 08:00, RN called into room due to patient shaking. Patient appeared to be having a seizure. O2 saturation dropped to 30's, heart rate dropped to 40s, patient unresponsive. Seizure like activity ceased just after RN fully came into room (less than one minute of witnessed activity between family and staff).  Patient wearing home bipap which is not connected to oxygen. Patient was a full code at this time so RN connected bag valve mask to 100% oxygen and began to bag patient (patient was having some respiratory effort but not adequate volume of breath). O2 saturation increased to 90s and heart rate increased to 90s. Family at bedside (husband and daughter) and stated patient had not had seizures in the past. Another RN called RT to get patient a hospital bipap which could deliver O2. Dr. Allena KatzPatel (attending) paged and Dr. Belia HemanKasa (intesivist) arrived at bedside and spoke with family about patient's current status, history, and plan of care. Dr. Belia HemanKasa spoke of continuing bipap at this time, giving seizure medication, but not taking patient to CT due to unstable respiratory status and family's desire not to have patient intubated. Spoke with Dr. Allena KatzPatel over the phone about patient's status and Dr. Clovis FredricksonKasa's discussion with family. Per MDs, currently will give patient keppra, patient DNR, will continue bipap and reassess later in shift for potential comfort care. Team will continue to monitor.

## 2016-11-30 NOTE — Progress Notes (Signed)
Name: Brooke York MRN: [id number] DOB: 1946/04/10    ADMISSION DATE:  11/12/2016 CONSULTATION DATE:  11/28/2016  REFERRING MD :  Dr. Rinaldo Ratel  CHIEF COMPLAINT: acute weakness   HISTORY OF PRESENT ILLNESS:   71 y.o. 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  -Since yesterday he hasn't had a need to change her depends at all. So she was brought to the emergency room and noted to be significantly dehydrated.  -Her creatinine is at 8 and she is anuric at this time. Significantly metabolic acidosis on the exam. She is being admitted for acute renal failure.   SUBJECTIVE: No acute issues overnight. Maintained on BiPAP with SPO2 stable in the mid 90s. Blood pressure elevated in the 170s because patient refused all oral medications last night. C/O mild pain and was given tylenol. Plan was to transfer patient to the floor this morning. However, at 0830 am, patient had what appears to be a seizure with a drop in SPO2 to the 30s, HR in the 40s and unresponsiveness. Patient was bagged and SPO2 improved to the 90s. Family declined intubation. Patient was placed back on BiPAP,and  loaded with keppra. Patient had been made DNR by family.    VITAL SIGNS: Temp:  [97.5 F (36.4 C)-98.7 F (37.1 C)] 97.5 F (36.4 C) (02/25 0100) Pulse Rate:  [44-107] 107 (02/25 0820) Resp:  [14-38] 34 (02/25 0820) BP: (151-188)/(74-132) 171/75 (02/25 0600) SpO2:  [38 %-100 %] 97 % (02/25 0820) FiO2 (%):   [100 %] 100 % (02/25 0807) Weight:  [93 lb 0.6 oz (42.2 kg)-108 lb (49 kg)] 93 lb 0.6 oz (42.2 kg) (02/24 1800)  PHYSICAL EXAMINATION:  GENERAL:critically ill appearing, on biPAP HEAD: Normocephalic, atraumatic.  EYES: Pupils equal, round, reactive to light.  No scleral icterus.  MOUTH: Moist mucosal membrane. NECK: Supple. No thyromegaly. No nodules. No JVD.  PULMONARY: CTA b/l CARDIOVASCULAR: S1 and S2. Regular rate and rhythm. No murmurs, rubs, or gallops.  GASTROINTESTINAL: Soft, nontender, -distended. No masses. Positive bowel sounds. No hepatosplenomegaly.  MUSCULOSKELETAL: No swelling, clubbing, or edema.  NEUROLOGIC: lethargic SKIN:intact,warm,dry     Recent Labs Lab 11/07/2016 1201 11/08/2016 2025 11/30/16 0613  NA 125* 126* 131*  K 3.4* 2.3* 3.8  CL 93* 98* 101  CO2 <7* 7* 11*  BUN 118* 101* 104*  CREATININE 8.19* 6.86* 7.25*  GLUCOSE 82 88 103*    Recent Labs Lab 11/13/2016 1631 11/30/16 0545  HGB 9.8* 7.5*  HCT 29.7* 20.9*  WBC 16.9* 10.7  PLT 399 276   Dg Chest Port 1 View  Result Date: 12/03/2016 CLINICAL DATA:  Central line placement EXAM: PORTABLE CHEST 1 VIEW COMPARISON:  11/28/2016 FINDINGS: Marked scoliotic deformity of the thoracolumbar spine. No focal consolidation or effusion. Elevated right diaphragm. Stable cardiomediastinal silhouette with atherosclerosis. Right-sided central venous catheter tip overlies the expected location of distal SVC allowing for scoliosis. There is no right pneumothorax. IMPRESSION: 1. Right-sided central venous catheter tip overlies expected location of SVC. No pneumothorax. 2. No acute infiltrate  or edema. Electronically Signed   By: Jasmine PangKim  Fujinaga M.D.   On: 20-Oct-2016 18:12   Dg Chest Port 1 View  Result Date: 11/23/2016 CLINICAL DATA:  Weakness.  Sepsis. EXAM: PORTABLE CHEST 1 VIEW COMPARISON:  08/08/2015 FINDINGS: There is a normal heart size. Aortic atherosclerosis noted. There is asymmetric elevation of the right  hemidiaphragm. No pleural effusion or edema. No airspace opacities. IMPRESSION: 1. No acute cardiopulmonary abnormalities. Electronically Signed   By: Signa Kellaylor  Stroud M.D.   On: 20-Oct-2016 12:30    DISCUSSION 71 yo with severe neuromusclular disease with acute renal failure with severe acidosis ASSESSMENT / PLAN:  Pulmonary  A:  Acute respiratory failure 2/2 to poor respiratory effort Severe acidosis H/o asthma P: Continue BiPAP Nebulized bronchodilator prn  Bicarb infusion  Renal A ESRD-Creatinine up to 7 Hypokalemia Hypocalcemia P: Follow up Nephrology consult Patient will need HD Monitor and correct electrolytes Renally dose medications IV hydration  Neurologic A: Post polio syndrome New onset seizure-likely secondary to severe acidosis and uremia P:  Keppra load and daily dose Seizure precautions CT Head EEG Neurology consult  Disposition and family update: Hold floor transfer in light of changes in patient's status. Daughter updated at bedside by Dr. Belia HemanKasa.   Plan of care discussed with Dr. Belia HemanKasa Total CCM time=45 minutes  Magdalene S. [facility] ANP-BC Pulmonary and Critical Care Medicine [facility] Pager [phone] or [phone]

## 2016-11-30 NOTE — Progress Notes (Signed)
CH responded to an OR for End of Life. Pt is on a ventilator and non-responsive. Husband, daughter, and daughter-in -law are bedside. Family has made the decision to transition to comfort care, but are wrestling with when to complete the transition. Family spent some time reflecting on Pt life. 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 Daughter in particular having trouble in "letting go" as this change happened suddenly. CH provided empathetic listening, hospitality, and prayer. CH will follow this family today.    11/30/16 1000  Clinical Encounter Type  Visited With Family;Patient and family together  Visit Type Initial;Spiritual support;Critical Care;Patient actively dying  Referral From Nurse  Spiritual Encounters  Spiritual Needs Prayer;Emotional;Grief support  Stress Factors  Family Stress Factors Exhausted;Health changes;Loss;Major life changes

## 2016-11-30 NOTE — Progress Notes (Signed)
Patient transitioned to comfort care. Morphine infusion started. Per family, due to history of polio, patient can only breathe standing, lying on extreme right side, or with bipap. MD did not want to continue patient on bipap, so patient currently on extreme right side with nasal cannula at 2 L for comfort. Patient currently resting comfortably with husband and other family at bedside. If patient still comfortable after an hour, Dr. Belia HemanKasa gave order that patient could move to 1C.

## 2016-11-30 NOTE — Progress Notes (Signed)
Patient with severe multiorgan failure with very poor prognosis with acute severe renal failure on top of advanced stage kidney disease and severe acidosis.  Case discussed with Dr Cherylann RatelLateef, will proceed with CMO    Patient/Family are satisfied with Plan of action and management. All questions answered  Lucie LeatherKurian David Zaryia Markel, M.D.  [facility]  Medical Director [facility] Medical Director [facility] Cardio-P[facility] Department

## 2016-11-30 NOTE — Progress Notes (Signed)
Doing well with ice chips but refused to try sips of water, "did not feel ready to drink or swallow yet". Refused Dulera and heparin. Pt explained risk of refusing heparin, SCD'S applied, but pt removed during night. Pt also complaining of pain at beginning of shift got order for PRN IV morphine, pt refused scared it would effect breathing, explained the low dose, still refused of fear of taking morphine. Offerd liquid Tylenol, ordered, administered. Pt remains on home/personal Bi-PaP, daughter at bedside.

## 2016-11-30 NOTE — Progress Notes (Signed)
I was called to bedside, patient with ACUTE RESP DISTRESS AND ACUTE AND SEVERE RESP FAILURE FROM SEIZURES, PLACED ON BIPAP. FAMILY AT BEDSIDE, FAMILY HAS CONFIRMED THAT PATIENT DOES NOT WANT VENT OR DIALYSIS.  I HAVE DISCUSSED POSSIBLY PROCEEDING WITH COMFORT CARE MEASURES.   Patient/Family are satisfied with Plan of action and management. All questions answered  Lucie LeatherKurian David Zakaria Sedor, M.D.  [facility]  Medical Director [facility] Medical Director [facility] Card[facility]

## 2016-11-30 NOTE — Progress Notes (Signed)
Pt transferred to room 122. Writer received report from Maralyn SagoSarah, Charity fundraiserN. Patient resting comfortably on Morphine infusion. Otilio JeffersonMadelyn S Fenton, RN

## 2016-11-30 NOTE — Progress Notes (Signed)
[facility] on 1C. Patient transfered to room 122. Family at bedside and aware of transfer. RN accompanied patient to new room and handed off to [facility] 2Maddy RN at bedside. Patient resting comfortably on morphine infusion.

## 2016-11-30 NOTE — Clinical Social Work Note (Signed)
CSW met with the patient's son and daughter-in-law at bedside to discuss the role of CSW in comfort care. The CSW offered emotional support. CSW will con't to follow pending any additional needs for the family at this time.  Santiago Bumpers, MSW, Latanya Presser [phone]

## 2016-11-30 NOTE — Progress Notes (Signed)
[facility]  ROUNDING NOTE   Subjective:  Patient well known to [place] from the office. Patient has advanced CKD at baseline with an EGFR 19. She presents now with nausea, vomiting, and decreased urine output. Upon initial evaluation she was found to have severe acute renal failure with metabolic acidosis. Initially there were some thoughts to provide renal placement therapy however the family actually declined this. Her status appears to be worse this a.m. and she has acute respiratory failure at the moment along with worsening renal function. Renal ultrasound was performed and was negative for hydronephrosis.   Objective:  Vital signs in last 24 hours:  Temp:  [97.5 F (36.4 C)-98.7 F (37.1 C)] 97.5 F (36.4 C) (02/25 0100) Pulse Rate:  [44-107] 107 (02/25 0820) Resp:  [14-38] 34 (02/25 0820) BP: (151-188)/(74-132) 171/75 (02/25 0600) SpO2:  [38 %-100 %] 97 % (02/25 0820) FiO2 (%):  [100 %] 100 % (02/25 0807) Weight:  [42.2 kg (93 lb 0.6 oz)-49 kg (108 lb)] 42.2 kg (93 lb 0.6 oz) (02/24 1800)  Weight change:  Filed Weights   11/20/2016 1200 11/10/2016 1800  Weight: 49 kg (108 lb) 42.2 kg (93 lb 0.6 oz)    Intake/Output: I/O last 3 completed shifts: In: 1400 [I.V.:1200; IV Piggyback:200] Out: 350 [Urine:350]   Intake/Output this shift:  No intake/output data recorded.  Physical Exam: General: Critically ill appearing  Head: Bipap facemask on at the moment  Eyes: Anicteric  Neck: Supple, trachea midline  Lungs:  Bilateral rhonchi, increased work of breathing, on bipap  Heart: S1S2 no rubs  Abdomen:  Soft, nontender, bowel sounds present  Extremities: trace peripheral edema.  Neurologic: Obtunded, not following commands  Skin: No lesions       Basic Metabolic Panel:  Recent Labs Lab 11/28/2016 1201 11/14/2016 2025 11/30/16 0613  NA 125* 126* 131*  K 3.4* 2.3* 3.8  CL 93* 98* 101  CO2 <7* 7* 11*  GLUCOSE 82 88 103*  BUN 118* 101* 104*  CREATININE  8.19* 6.86* 7.25*  CALCIUM 5.9* 4.8* 5.2*  MG  --   --  1.1*  PHOS  --   --  11.8*    Liver Function Tests:  Recent Labs Lab 12/03/2016 1201  AST 22  ALT 18  ALKPHOS 66  BILITOT 0.8  PROT 7.4  ALBUMIN 3.8    Recent Labs Lab 11/28/2016 1201  LIPASE 487*   No results for input(s): AMMONIA in the last 168 hours.  CBC:  Recent Labs Lab 11/28/2016 1631 11/30/16 0545  WBC 16.9* 10.7  HGB 9.8* 7.5*  HCT 29.7* 20.9*  MCV 94.2 89.0  PLT 399 276    Cardiac Enzymes:  Recent Labs Lab 11/06/2016 1201  TROPONINI 0.04*    BNP: Invalid input(s): POCBNP  CBG: No results for input(s): GLUCAP in the last 168 hours.  Microbiology: Results for orders placed or performed during the hospital encounter of 11/09/2016  Blood Culture (routine x 2)     Status: None (Preliminary result)   Collection Time: 11/28/2016 12:01 PM  Result Value Ref Range Status   Specimen Description BLOOD LEFT ASSIST CONTROL  Final   Special Requests   Final    BOTTLES DRAWN AEROBIC AND ANAEROBIC AER10ML ANA10ML   Culture NO GROWTH < 24 HOURS  Final   Report Status PENDING  Incomplete  Blood Culture (routine x 2)     Status: None (Preliminary result)   Collection Time: 11/23/2016 12:01 PM  Result Value Ref Range Status  Specimen Description BLOOD RIGHT ASSIST CONTROL  Final   Special Requests BOTTLES DRAWN AEROBIC AND ANAEROBIC ANA11ML AER9ML  Final   Culture NO GROWTH < 24 HOURS  Final   Report Status PENDING  Incomplete  MRSA PCR Screening     Status: None   Collection Time: 11/08/2016  4:11 PM  Result Value Ref Range Status   MRSA by PCR NEGATIVE NEGATIVE Final    Comment:        The GeneXpert MRSA Assay (FDA approved for NASAL specimens only), is one component of a comprehensive MRSA colonization surveillance program. It is not intended to diagnose MRSA infection nor to guide or monitor treatment for MRSA infections.     Coagulation Studies:  Recent Labs  11/13/2016 1201  LABPROT 14.1   INR 1.09    Urinalysis:  Recent Labs  11/24/2016 1201  COLORURINE YELLOW*  LABSPEC 1.008  PHURINE 5.0  GLUCOSEU 50*  HGBUR MODERATE*  BILIRUBINUR NEGATIVE  KETONESUR NEGATIVE  PROTEINUR >=300*  NITRITE NEGATIVE  LEUKOCYTESUR NEGATIVE      Imaging: Dg Chest Port 1 View  Result Date: 11/28/2016 CLINICAL DATA:  Central line placement EXAM: PORTABLE CHEST 1 VIEW COMPARISON:  11/10/2016 FINDINGS: Marked scoliotic deformity of the thoracolumbar spine. No focal consolidation or effusion. Elevated right diaphragm. Stable cardiomediastinal silhouette with atherosclerosis. Right-sided central venous catheter tip overlies the expected location of distal SVC allowing for scoliosis. There is no right pneumothorax. IMPRESSION: 1. Right-sided central venous catheter tip overlies expected location of SVC. No pneumothorax. 2. No acute infiltrate or edema. Electronically Signed   By: Donavan Foil M.D.   On: 11/28/2016 18:12   Dg Chest Port 1 View  Result Date: 11/06/2016 CLINICAL DATA:  Weakness.  Sepsis. EXAM: PORTABLE CHEST 1 VIEW COMPARISON:  08/08/2015 FINDINGS: There is a normal heart size. Aortic atherosclerosis noted. There is asymmetric elevation of the right hemidiaphragm. No pleural effusion or edema. No airspace opacities. IMPRESSION: 1. No acute cardiopulmonary abnormalities. Electronically Signed   By: Kerby Moors M.D.   On: 11/28/2016 12:30     Medications:   . morphine     . sodium chloride flush  3 mL Intravenous Q12H   acetaminophen (TYLENOL) oral liquid 160 mg/5 mL, acetaminophen, LORazepam, morphine  Assessment/ Plan:  71 y.o. 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   1.  Acute renal failure. 2.  CKD stage IV baseline egfr 19. 3.   Metabolic acidosis. 4.  Acute respiratory failure.  Plan: Patient seen and evaluated. I spent consider will time talking with the patient's family. She has severe acute renal failure in the setting of advanced renal dysfunction at baseline. The patient's husband states that she's been declining recently. Renal function actually worse today. Metabolic acidosis slightly improved. Renal ultrasound was performed and was negative for hydronephrosis. The patient's family has not wanted renal replacement therapy and they have indicated this in the past as well. In keeping with her wishes we will not be performing renal replacement therapy. At this point in time the family requests comfort care. This was discussed with pulmonary/critical care today. Comfort care order set will be placed. She will be started on a morphine drip and BiPAP will be taken off. All the family's concerns and questions were answered. Thanks for allowing [place] to participate.  LOS: 1 Nadiyah Zeis 2/25/201810:21 AM

## 2016-11-30 NOTE — Progress Notes (Signed)
Electrolyte replacement  2/25 AM Ca 5.2 and Mg 1.1. Calcium gluconate 2 grams IV x1 ordered, followed by magnesium sulfate 4 grams IV x1. Recheck BMP and Mg in AM.

## 2016-12-01 LAB — URINE CULTURE: Culture: 70000 — AB

## 2016-12-01 LAB — GLUCOSE, CAPILLARY: Glucose-Capillary: 97 mg/dL (ref 65–99)

## 2016-12-04 LAB — CULTURE, BLOOD (ROUTINE X 2)
CULTURE: NO GROWTH
CULTURE: NO GROWTH

## 2016-12-04 NOTE — Progress Notes (Signed)
[facility] - [place] [facility]                                                                                                                                                                                  Patient Demographics   Brooke York, is a 71 y.o. female, DOB - 08/28/1946, [id number]  Admit date - 10-04-17   Admitting Physician Enid Baasadhika Kalisetti, MD  Outpatient Primary MD for the patient is Erin FullingKurian Kasa, MD   LOS - 2  Subjective: Patient was noted to have a seizure with decrease in responsiveness. Patient was seen by the ICU attending and placed on BiPAP. Her daughter and husband at bedside patient currently unresponsive    Review of Systems:   CONSTITUTIONAL: Unable to provide due to patient being unresponsive  Vitals:   Vitals:   11/30/16 0820 11/30/16 0900 11/30/16 1000 12/02/2016 0039  BP:  (!) 139/57 130/87   Pulse: (!) 107 85 79   Resp: (!) 34 19 20   Temp:      TempSrc:      SpO2: 97% 97% 96%   Weight:    93 lb 0.6 oz (42.2 kg)  Height:        Wt Readings from Last 3 Encounters:  11/21/2016 93 lb 0.6 oz (42.2 kg)  08/08/15 102 lb 6.4 oz (46.4 kg)    No intake or output data in the 24 hours ending 11/10/2016 0811  Physical Exam:   GENERAL: Critically ill-appearing on BiPAP HEAD, EYES, EARS, NOSE AND THROAT: Pupils nonreactive NECK: Supple. There is no jugular venous distention. No bruits, no lymphadenopathy, no thyromegaly.  HEART: Regular rate and rhythm,. No murmurs, no rubs, no clicks.  LUNGS: Clear to auscultation bilaterally. No rales or rhonchi. No wheezes.  ABDOMEN: Soft, flat, nontender, nondistended. Has good bowel sounds. No hepatosplenomegaly appreciated.  EXTREMITIES: No evidence of any cyanosis, clubbing, or peripheral edema.  +2 pedal and radial pulses bilaterally.  NEUROLOGIC: Unresponsive SKIN: Moist and warm with no rashes appreciated.  Psych: Unresponsive  LN: No inguinal LN enlargement    Antibiotics    Anti-infectives    Start     Dose/Rate Route Frequency Ordered Stop   07/13/2017 1600  ciprofloxacin (CIPRO) IVPB 400 mg  Status:  Discontinued     400 mg 200 mL/hr over 60 Minutes Intravenous Every 24 hours 07/13/2017 1445 11/30/16 1015   07/13/2017 1300  aztreonam (AZACTAM) 2 g in dextrose 5 % 50 mL IVPB     2 g 100 mL/hr over 30 Minutes Intravenous  Once 07/13/2017 1228 07/13/2017 1349   07/13/2017 1245  vancomycin (VANCOCIN) IVPB 1000 mg/200 mL premix  Status:  Discontinued  1,000 mg 200 mL/hr over 60 Minutes Intravenous  Once 11/24/2016 1244 11/19/2016 1411      Medications   Scheduled Meds: Continuous Infusions: PRN Meds:.   Data Review:   Micro Results Recent Results (from the past 240 hour(s))  Blood Culture (routine x 2)     Status: None (Preliminary result)   Collection Time: 11/12/2016 12:01 PM  Result Value Ref Range Status   Specimen Description BLOOD LEFT ASSIST CONTROL  Final   Special Requests   Final    BOTTLES DRAWN AEROBIC AND ANAEROBIC AER10ML ANA10ML   Culture NO GROWTH 2 DAYS  Final   Report Status PENDING  Incomplete  Blood Culture (routine x 2)     Status: None (Preliminary result)   Collection Time: 11/11/2016 12:01 PM  Result Value Ref Range Status   Specimen Description BLOOD RIGHT ASSIST CONTROL  Final   Special Requests BOTTLES DRAWN AEROBIC AND ANAEROBIC ANA11ML AER9ML  Final   Culture NO GROWTH 2 DAYS  Final   Report Status PENDING  Incomplete  MRSA PCR Screening     Status: None   Collection Time: 11/13/2016  4:11 PM  Result Value Ref Range Status   MRSA by PCR NEGATIVE NEGATIVE Final    Comment:        The GeneXpert MRSA Assay (FDA approved for NASAL specimens only), is one component of a comprehensive MRSA colonization surveillance program. It is not intended to diagnose MRSA infection nor to guide or monitor treatment for MRSA infections.     Radiology Reports Dg Chest Port 1 View  Result Date: 11/19/2016 CLINICAL DATA:  Central line  placement EXAM: PORTABLE CHEST 1 VIEW COMPARISON:  12/02/2016 FINDINGS: Marked scoliotic deformity of the thoracolumbar spine. No focal consolidation or effusion. Elevated right diaphragm. Stable cardiomediastinal silhouette with atherosclerosis. Right-sided central venous catheter tip overlies the expected location of distal SVC allowing for scoliosis. There is no right pneumothorax. IMPRESSION: 1. Right-sided central venous catheter tip overlies expected location of SVC. No pneumothorax. 2. No acute infiltrate or edema. Electronically Signed   By: Jasmine Pang M.D.   On: 11/10/2016 18:12   Dg Chest Port 1 View  Result Date: 11/12/2016 CLINICAL DATA:  Weakness.  Sepsis. EXAM: PORTABLE CHEST 1 VIEW COMPARISON:  08/08/2015 FINDINGS: There is a normal heart size. Aortic atherosclerosis noted. There is asymmetric elevation of the right hemidiaphragm. No pleural effusion or edema. No airspace opacities. IMPRESSION: 1. No acute cardiopulmonary abnormalities. Electronically Signed   By: Signa Kell M.D.   On: 11/23/2016 12:30     CBC  Recent Labs Lab 11/27/2016 1631 11/30/16 0545  WBC 16.9* 10.7  HGB 9.8* 7.5*  HCT 29.7* 20.9*  PLT 399 276  MCV 94.2 89.0  MCH 30.9 32.0  MCHC 32.9 35.9  RDW 18.6* 17.9*    Chemistries   Recent Labs Lab 11/11/2016 1201 11/11/2016 2025 11/30/16 0613  NA 125* 126* 131*  K 3.4* 2.3* 3.8  CL 93* 98* 101  CO2 <7* 7* 11*  GLUCOSE 82 88 103*  BUN 118* 101* 104*  CREATININE 8.19* 6.86* 7.25*  CALCIUM 5.9* 4.8* 5.2*  MG  --   --  1.1*  AST 22  --   --   ALT 18  --   --   ALKPHOS 66  --   --   BILITOT 0.8  --   --    ------------------------------------------------------------------------------------------------------------------ estimated creatinine clearance is 4.6 mL/min (by C-G formula based on SCr of 7.25 mg/dL (H)). ------------------------------------------------------------------------------------------------------------------  No results for  input(s): HGBA1C in the last 72 hours. ------------------------------------------------------------------------------------------------------------------ No results for input(s): CHOL, HDL, LDLCALC, TRIG, CHOLHDL, LDLDIRECT in the last 72 hours. ------------------------------------------------------------------------------------------------------------------ No results for input(s): TSH, T4TOTAL, T3FREE, THYROIDAB in the last 72 hours.  Invalid input(s): FREET3 ------------------------------------------------------------------------------------------------------------------ No results for input(s): VITAMINB12, FOLATE, FERRITIN, TIBC, IRON, RETICCTPCT in the last 72 hours.  Coagulation profile  Recent Labs Lab Dec 26, 2016 1201  INR 1.09    No results for input(s): DDIMER in the last 72 hours.  Cardiac Enzymes  Recent Labs Lab 12/26/2016 1201  TROPONINI 0.04*   ------------------------------------------------------------------------------------------------------------------ Invalid input(s): POCBNP    Assessment & Plan   Patient is a 71 year old admitted with acute renal failure and weakness  1. Tonic-clonic seizure suspect due to her metabolic encephalopathy I have ordered a dose of IV Keppra Discussed with the family that she is not stable for a CT of the head they are agreeable Neurology consults been placed  2. Acute renal failure on chronic kidney disease stage IV suspect due to ATN as a result of recent nausea vomiting and decreased by mouth intake Renal function is worse nephrology will be seeing the patient As per patient's wishes does not want hemodialysis  3. Severe metabolic acidosis secondary to renal failure on bicarbonate drip  4. Acute encephalopathy metabolic in nature prognosis poor family understands has been made DO NOT RESUSCITATE  5. Acute cystitis continue antibiotics  6. 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  and metabolic encephalopathy continue BiPAP    Code Status History    Date Active Date Inactive Code Status Order ID Comments User Context   11/30/2016 10:07 AM 11/28/2016  7:07 AM DNR [id number]  Erin Fulling, MD Inpatient   11/30/2016  8:26 AM 11/30/2016 10:07 AM DNR [id number]  Erin Fulling, MD Inpatient   2016-12-26  4:32 PM 11/30/2016  8:26 AM Full Code [id number]  Enid Baas, MD Inpatient   08/08/2015  8:48 PM 08/10/2015  5:23 PM Full Code [id number]  Enedina Finner, MD Inpatient    Questions for Most Recent Historical Code Status (Order [id number])    Question Answer Comment   In the event of cardiac or respiratory ARREST Do not call a "code blue"    In the event of cardiac or respiratory ARREST Do not perform Intubation, CPR, defibrillation or ACLS    In the event of cardiac or respiratory ARREST Use medication by any route, position, wound care, and other measures to relive pain and suffering. May use oxygen, suction and manual treatment of airway obstruction as needed for comfort.         Advance Directive Documentation   Flowsheet Row Most Recent Value  Type of Advance Directive  Healthcare Power of Attorney, Living will  Pre-existing out of facility DNR order (yellow form or pink MOST form)  No data  "MOST" Form in Place?  No data           Consults Pulmonary nephrology DVT Prophylaxis  SCDs  Lab Results  Component Value Date   PLT 276 11/30/2016     Time Spent in minutes    Greater than 50% of time spent in care coordination and counseling patient regarding the condition and plan of care.   Auburn Bilberry M.D on 11/21/2016 at 8:11 AM  Between 7am to 6pm - Pager - [phone]  After 6pm go to [email or web address] - password [email or web address] [facility]  [facility]   Office  [phone]

## 2016-12-04 NOTE — Progress Notes (Signed)
Pt having a seizure, IV ativan given-pt now with agonal breathing-emotional support given to husband.

## 2016-12-04 NOTE — Progress Notes (Signed)
Pt expired at 0039. Pronounced by this RN and Irving Copashristina Welch, RN. Husband at bedside. Emotional support provided. MD, AC, and COPA notified.

## 2016-12-04 NOTE — Death Summary Note (Signed)
[facility] -  at [facility] Date of Admission: 2017-05-28 11:51 AM  Date of death: 11/17/2016   Admitting diagnosis: Acute renal failure Weakness Metabolic acidosis   Diagnosis at time of death 1. Acute respiratory failure 2. Seizure 3. Acute renal failure 4. Severe metabolic acidosis 5. Acute encephalopathy 6. Bulbar polio     Hospital course Brooke LatusKaren Cirelli  is a 71 y.o. 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 Patient has bulbar polio as a result of which she has diaphragmatic weakness, respiratory muscle weakness chronically on BiPAP. Who is presenting with acute renal failure. Patient was admitted to the ICU aggressive IV hydration was done. Despite that patient's renal function worsened by day 2. She also had worsening respiratory failure. She was seen by pulmonary. And continued on BiPAP. Patient also had a tonic-clonic seizure. She remained unresponsive. Therefore discussions were held with the family regarding CODE STATUS and comfort care patient was made comfort care transferred to the floor. Where she passed away comfortably family was notified of death.         TOTAL TIME TAKING CARE OF THIS PATIENT:7135min  Auburn BilberryPATEL, Aqib Lough M.D on 11/13/2016 at 8:16 AM  Between 7am to 6pm - Pager - [phone]  After 6pm go to [email or web address] - password [email or web address] [facility]  [facility]  Office  [phone]  CC: Primary care physician; Erin FullingKurian Kasa, MD

## 2016-12-04 NOTE — Progress Notes (Signed)
Pt without respirations or heart beat. Emotional support given to husband.

## 2016-12-04 DEATH — deceased

## 2018-10-30 IMAGING — DX DG CHEST 1V PORT
1 series · 1 of 1 positions shown · non-contrast
Comparison: 08/08/2015

CLINICAL DATA: Weakness.  Sepsis.

EXAM:
PORTABLE CHEST 1 VIEW

[chest ap]
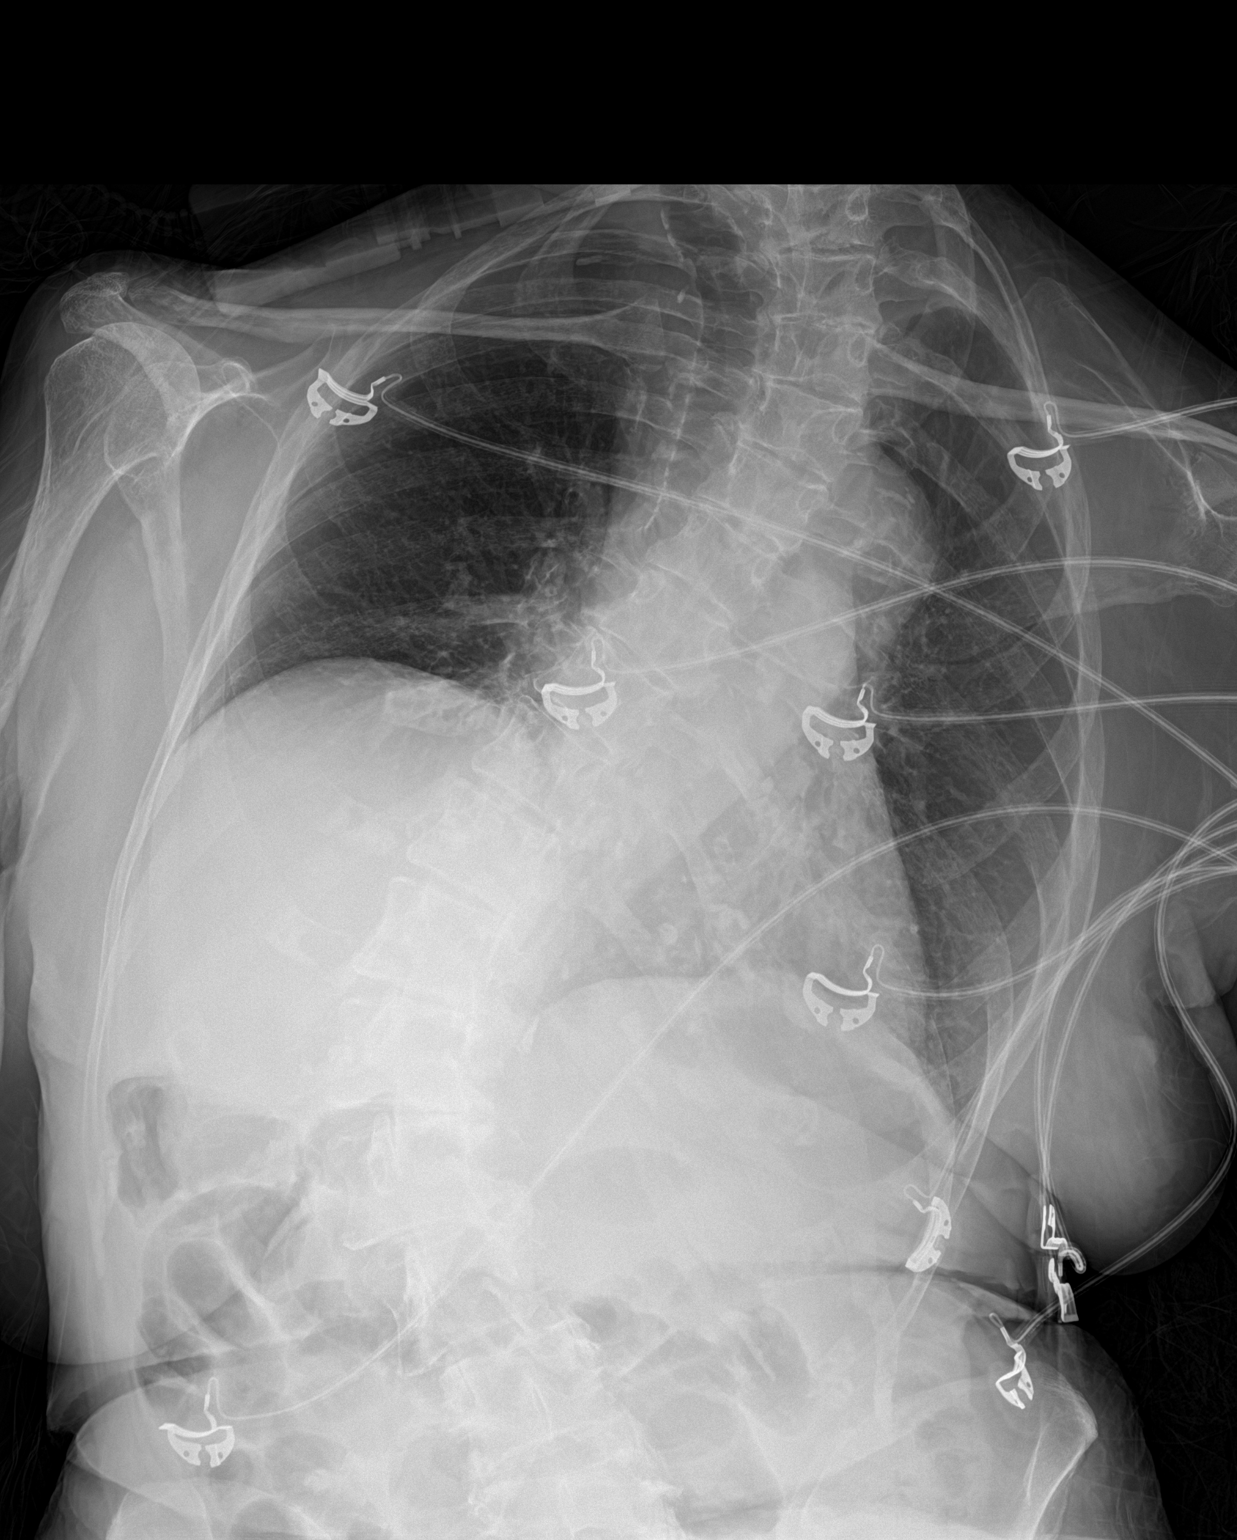

[1 of 1 positions shown; findings below may reference images not displayed]

FINDINGS: There is a normal heart size. Aortic atherosclerosis noted. There is
asymmetric elevation of the right hemidiaphragm. No pleural effusion
or edema. No airspace opacities.
IMPRESSION: 1. No acute cardiopulmonary abnormalities.
